# Patient Record
Sex: Female | Born: 1940
Health system: Southern US, Community
[De-identification: ages and names within clinical notes are randomized; demographics above are authoritative.]

## PROBLEM LIST (undated history)

## (undated) DIAGNOSIS — Z973 Presence of spectacles and contact lenses: Secondary | ICD-10-CM

## (undated) DIAGNOSIS — I10 Essential (primary) hypertension: Secondary | ICD-10-CM

## (undated) DIAGNOSIS — J302 Other seasonal allergic rhinitis: Secondary | ICD-10-CM

## (undated) DIAGNOSIS — D649 Anemia, unspecified: Secondary | ICD-10-CM

## (undated) DIAGNOSIS — M1990 Unspecified osteoarthritis, unspecified site: Secondary | ICD-10-CM

## (undated) DIAGNOSIS — Z972 Presence of dental prosthetic device (complete) (partial): Secondary | ICD-10-CM

## (undated) DIAGNOSIS — E7800 Pure hypercholesterolemia, unspecified: Secondary | ICD-10-CM

## (undated) HISTORY — PX: COLONOSCOPY: SHX174

---

## 1998-08-05 ENCOUNTER — Other Ambulatory Visit: Admission: RE | Admit: 1998-08-05 | Discharge: 1998-08-05 | Payer: Self-pay | Admitting: *Deleted

## 1999-10-06 ENCOUNTER — Other Ambulatory Visit: Admission: RE | Admit: 1999-10-06 | Discharge: 1999-10-06 | Payer: Self-pay | Admitting: *Deleted

## 2000-12-02 ENCOUNTER — Other Ambulatory Visit: Admission: RE | Admit: 2000-12-02 | Discharge: 2000-12-02 | Payer: Self-pay | Admitting: *Deleted

## 2002-11-16 ENCOUNTER — Encounter: Admission: RE | Admit: 2002-11-16 | Discharge: 2002-11-16 | Payer: Self-pay

## 2003-08-03 HISTORY — PX: CYST EXCISION: SHX5701

## 2004-05-29 ENCOUNTER — Encounter: Admission: RE | Admit: 2004-05-29 | Discharge: 2004-05-29 | Payer: Self-pay | Admitting: Family Medicine

## 2004-06-11 ENCOUNTER — Ambulatory Visit (HOSPITAL_COMMUNITY): Admission: RE | Admit: 2004-06-11 | Discharge: 2004-06-11 | Payer: Self-pay | Admitting: Orthopedic Surgery

## 2004-06-11 ENCOUNTER — Encounter (INDEPENDENT_AMBULATORY_CARE_PROVIDER_SITE_OTHER): Payer: Self-pay | Admitting: Specialist

## 2004-06-11 ENCOUNTER — Ambulatory Visit (HOSPITAL_BASED_OUTPATIENT_CLINIC_OR_DEPARTMENT_OTHER): Admission: RE | Admit: 2004-06-11 | Discharge: 2004-06-11 | Payer: Self-pay | Admitting: Orthopedic Surgery

## 2008-08-02 HISTORY — PX: TRIGGER FINGER RELEASE: SHX641

## 2009-06-03 ENCOUNTER — Ambulatory Visit (HOSPITAL_BASED_OUTPATIENT_CLINIC_OR_DEPARTMENT_OTHER): Admission: RE | Admit: 2009-06-03 | Discharge: 2009-06-03 | Payer: Self-pay | Admitting: Orthopedic Surgery

## 2010-11-04 LAB — POCT HEMOGLOBIN-HEMACUE
Hemoglobin: 7.8 g/dL — ABNORMAL LOW (ref 12.0–15.0)
Hemoglobin: 8 g/dL — ABNORMAL LOW (ref 12.0–15.0)

## 2010-12-18 NOTE — Op Note (Signed)
NAMEMICALAH, CABEZAS                ACCOUNT NO.:  [id number]   MEDICAL RECORD NO.:  [id number]          PATIENT TYPE:  AMB   LOCATION:  DSC                          FACILITY:  [facility]   PHYSICIAN:  Cindee Salt, M.D.       DATE OF BIRTH:  1940-08-18   DATE OF PROCEDURE:  06/11/2004  DATE OF DISCHARGE:                                 OPERATIVE REPORT   PREOPERATIVE DIAGNOSIS:  Stenosing tenosynovitis with thumb lock, right  thumb.   POSTOPERATIVE DIAGNOSIS:  Stenosing tenosynovitis with cyst, right thumb.   OPERATION PERFORMED:  Excision of cyst, release of A-1 pulley, right thumb.   SURGEON:  Cindee Salt, M.D.   ASSISTANTCarolyne Fiscal.   ANESTHESIA:  Forearm based IV regional.   INDICATIONS FOR PROCEDURE:  The patient is a 70 year old female with a  history of triggering of her right thumb which has locked.  She has a mass  on the palmar aspect of the A-1 pulley.   DESCRIPTION OF PROCEDURE:  The patient was brought to the operating room  where forearm based IV regional anesthetic was carried out without  difficulty.  She was prepped using DuraPrep in supine position, right arm  free.  A transverse incision was made over the flexor crease of her right  thumb and carried down through subcutaneous tissue.  Neurovascular  structures identified and protected.  Bleeders were electrocauterized.  The  flexor sheath was found to be extremely thick.  A cyst was present.  This  was removed.  The flexor sheath was then released on its radial aspect.  The  nodule on the flexor tendon was immediately apparent.  On completion of the  release, protecting the oblique pulley, the thumb was able to be placed  through a full range of motion, no further triggering was evident.  The  wound was irrigated.  The skin was closed with interrupted 5-0 nylon  sutures.  A sterile compressive dressing was applied.  The patient tolerated  the procedure well and was taken to the recovery room for observation in  satisfactory condition.  She is discharged to home to return to the [facility] of [place] in one week on Vicodin.       GK/MEDQ  D:  06/11/2004  T:  06/11/2004  Job:  [id number]

## 2012-01-13 DIAGNOSIS — M2010 Hallux valgus (acquired), unspecified foot: Secondary | ICD-10-CM | POA: Diagnosis not present

## 2012-01-13 DIAGNOSIS — M7660 Achilles tendinitis, unspecified leg: Secondary | ICD-10-CM | POA: Diagnosis not present

## 2012-01-13 DIAGNOSIS — M7730 Calcaneal spur, unspecified foot: Secondary | ICD-10-CM | POA: Diagnosis not present

## 2012-02-07 DIAGNOSIS — L600 Ingrowing nail: Secondary | ICD-10-CM | POA: Diagnosis not present

## 2012-02-07 DIAGNOSIS — M7660 Achilles tendinitis, unspecified leg: Secondary | ICD-10-CM | POA: Diagnosis not present

## 2012-03-09 DIAGNOSIS — M7660 Achilles tendinitis, unspecified leg: Secondary | ICD-10-CM | POA: Diagnosis not present

## 2012-03-09 DIAGNOSIS — M7730 Calcaneal spur, unspecified foot: Secondary | ICD-10-CM | POA: Diagnosis not present

## 2012-05-08 DIAGNOSIS — H2510 Age-related nuclear cataract, unspecified eye: Secondary | ICD-10-CM | POA: Diagnosis not present

## 2012-05-16 DIAGNOSIS — Z23 Encounter for immunization: Secondary | ICD-10-CM | POA: Diagnosis not present

## 2012-08-08 DIAGNOSIS — H40019 Open angle with borderline findings, low risk, unspecified eye: Secondary | ICD-10-CM | POA: Diagnosis not present

## 2012-08-09 DIAGNOSIS — Z713 Dietary counseling and surveillance: Secondary | ICD-10-CM | POA: Diagnosis not present

## 2012-08-09 DIAGNOSIS — R030 Elevated blood-pressure reading, without diagnosis of hypertension: Secondary | ICD-10-CM | POA: Diagnosis not present

## 2012-09-11 ENCOUNTER — Other Ambulatory Visit: Payer: Self-pay | Admitting: Internal Medicine

## 2012-09-11 DIAGNOSIS — Z1231 Encounter for screening mammogram for malignant neoplasm of breast: Secondary | ICD-10-CM

## 2012-09-20 DIAGNOSIS — I10 Essential (primary) hypertension: Secondary | ICD-10-CM | POA: Diagnosis not present

## 2012-09-20 DIAGNOSIS — E7800 Pure hypercholesterolemia, unspecified: Secondary | ICD-10-CM | POA: Diagnosis not present

## 2012-09-28 DIAGNOSIS — IMO0002 Reserved for concepts with insufficient information to code with codable children: Secondary | ICD-10-CM | POA: Diagnosis not present

## 2012-09-28 DIAGNOSIS — M999 Biomechanical lesion, unspecified: Secondary | ICD-10-CM | POA: Diagnosis not present

## 2012-09-28 DIAGNOSIS — M25559 Pain in unspecified hip: Secondary | ICD-10-CM | POA: Diagnosis not present

## 2012-10-02 ENCOUNTER — Other Ambulatory Visit: Payer: Self-pay

## 2012-10-02 DIAGNOSIS — Z1231 Encounter for screening mammogram for malignant neoplasm of breast: Secondary | ICD-10-CM

## 2012-10-04 ENCOUNTER — Inpatient Hospital Stay: Admission: RE | Admit: 2012-10-04 | Payer: Self-pay | Source: Ambulatory Visit

## 2012-10-10 DIAGNOSIS — M7660 Achilles tendinitis, unspecified leg: Secondary | ICD-10-CM | POA: Diagnosis not present

## 2012-10-10 DIAGNOSIS — M928 Other specified juvenile osteochondrosis: Secondary | ICD-10-CM | POA: Diagnosis not present

## 2012-10-11 ENCOUNTER — Ambulatory Visit: Payer: Self-pay

## 2012-10-31 DIAGNOSIS — M7660 Achilles tendinitis, unspecified leg: Secondary | ICD-10-CM | POA: Diagnosis not present

## 2012-11-02 ENCOUNTER — Ambulatory Visit: Payer: Self-pay

## 2012-11-03 ENCOUNTER — Ambulatory Visit: Payer: Self-pay | Admitting: Internal Medicine

## 2012-11-03 DIAGNOSIS — M7660 Achilles tendinitis, unspecified leg: Secondary | ICD-10-CM | POA: Diagnosis not present

## 2012-11-06 DIAGNOSIS — M7660 Achilles tendinitis, unspecified leg: Secondary | ICD-10-CM | POA: Diagnosis not present

## 2012-11-07 ENCOUNTER — Ambulatory Visit
Admission: RE | Admit: 2012-11-07 | Discharge: 2012-11-07 | Disposition: A | Discharge status: Home / Self Care | Payer: Medicare Other | Source: Ambulatory Visit

## 2012-11-07 DIAGNOSIS — Z1231 Encounter for screening mammogram for malignant neoplasm of breast: Secondary | ICD-10-CM | POA: Diagnosis not present

## 2012-11-08 DIAGNOSIS — M7660 Achilles tendinitis, unspecified leg: Secondary | ICD-10-CM | POA: Diagnosis not present

## 2012-11-10 DIAGNOSIS — M7660 Achilles tendinitis, unspecified leg: Secondary | ICD-10-CM | POA: Diagnosis not present

## 2012-11-13 DIAGNOSIS — M7660 Achilles tendinitis, unspecified leg: Secondary | ICD-10-CM | POA: Diagnosis not present

## 2012-11-15 DIAGNOSIS — M7660 Achilles tendinitis, unspecified leg: Secondary | ICD-10-CM | POA: Diagnosis not present

## 2012-11-17 DIAGNOSIS — M7660 Achilles tendinitis, unspecified leg: Secondary | ICD-10-CM | POA: Diagnosis not present

## 2012-12-21 DIAGNOSIS — I10 Essential (primary) hypertension: Secondary | ICD-10-CM | POA: Diagnosis not present

## 2012-12-21 DIAGNOSIS — E7800 Pure hypercholesterolemia, unspecified: Secondary | ICD-10-CM | POA: Diagnosis not present

## 2013-05-04 DIAGNOSIS — Z23 Encounter for immunization: Secondary | ICD-10-CM | POA: Diagnosis not present

## 2013-05-24 DIAGNOSIS — H2510 Age-related nuclear cataract, unspecified eye: Secondary | ICD-10-CM | POA: Diagnosis not present

## 2013-05-24 DIAGNOSIS — H40019 Open angle with borderline findings, low risk, unspecified eye: Secondary | ICD-10-CM | POA: Diagnosis not present

## 2013-06-22 DIAGNOSIS — E2839 Other primary ovarian failure: Secondary | ICD-10-CM | POA: Diagnosis not present

## 2013-06-22 DIAGNOSIS — I10 Essential (primary) hypertension: Secondary | ICD-10-CM | POA: Diagnosis not present

## 2013-06-22 DIAGNOSIS — E7800 Pure hypercholesterolemia, unspecified: Secondary | ICD-10-CM | POA: Diagnosis not present

## 2013-07-19 DIAGNOSIS — D485 Neoplasm of uncertain behavior of skin: Secondary | ICD-10-CM | POA: Diagnosis not present

## 2013-07-19 DIAGNOSIS — M899 Disorder of bone, unspecified: Secondary | ICD-10-CM | POA: Diagnosis not present

## 2013-07-19 DIAGNOSIS — Z780 Asymptomatic menopausal state: Secondary | ICD-10-CM | POA: Diagnosis not present

## 2013-09-20 ENCOUNTER — Other Ambulatory Visit: Payer: Self-pay | Admitting: Gastroenterology

## 2013-09-20 DIAGNOSIS — D126 Benign neoplasm of colon, unspecified: Secondary | ICD-10-CM | POA: Diagnosis not present

## 2013-09-20 DIAGNOSIS — Z1211 Encounter for screening for malignant neoplasm of colon: Secondary | ICD-10-CM | POA: Diagnosis not present

## 2013-09-20 DIAGNOSIS — K620 Anal polyp: Secondary | ICD-10-CM | POA: Diagnosis not present

## 2013-09-20 DIAGNOSIS — K5730 Diverticulosis of large intestine without perforation or abscess without bleeding: Secondary | ICD-10-CM | POA: Diagnosis not present

## 2013-09-20 DIAGNOSIS — K621 Rectal polyp: Secondary | ICD-10-CM | POA: Diagnosis not present

## 2013-09-20 DIAGNOSIS — Q438 Other specified congenital malformations of intestine: Secondary | ICD-10-CM | POA: Diagnosis not present

## 2014-05-21 DIAGNOSIS — L989 Disorder of the skin and subcutaneous tissue, unspecified: Secondary | ICD-10-CM | POA: Diagnosis not present

## 2014-05-21 DIAGNOSIS — E780 Pure hypercholesterolemia: Secondary | ICD-10-CM | POA: Diagnosis not present

## 2014-05-21 DIAGNOSIS — I10 Essential (primary) hypertension: Secondary | ICD-10-CM | POA: Diagnosis not present

## 2014-05-21 DIAGNOSIS — M949 Disorder of cartilage, unspecified: Secondary | ICD-10-CM | POA: Diagnosis not present

## 2014-05-28 DIAGNOSIS — H1045 Other chronic allergic conjunctivitis: Secondary | ICD-10-CM | POA: Diagnosis not present

## 2014-05-28 DIAGNOSIS — Z23 Encounter for immunization: Secondary | ICD-10-CM | POA: Diagnosis not present

## 2014-05-28 DIAGNOSIS — H4011X1 Primary open-angle glaucoma, mild stage: Secondary | ICD-10-CM | POA: Diagnosis not present

## 2014-06-03 ENCOUNTER — Other Ambulatory Visit: Payer: Self-pay | Admitting: Dermatology

## 2014-06-03 DIAGNOSIS — C44311 Basal cell carcinoma of skin of nose: Secondary | ICD-10-CM | POA: Diagnosis not present

## 2014-06-17 DIAGNOSIS — H04123 Dry eye syndrome of bilateral lacrimal glands: Secondary | ICD-10-CM | POA: Diagnosis not present

## 2014-06-17 DIAGNOSIS — H52223 Regular astigmatism, bilateral: Secondary | ICD-10-CM | POA: Diagnosis not present

## 2014-06-17 DIAGNOSIS — H1045 Other chronic allergic conjunctivitis: Secondary | ICD-10-CM | POA: Diagnosis not present

## 2014-06-17 DIAGNOSIS — H524 Presbyopia: Secondary | ICD-10-CM | POA: Diagnosis not present

## 2014-06-17 DIAGNOSIS — H5203 Hypermetropia, bilateral: Secondary | ICD-10-CM | POA: Diagnosis not present

## 2014-07-11 DIAGNOSIS — C44311 Basal cell carcinoma of skin of nose: Secondary | ICD-10-CM | POA: Diagnosis not present

## 2014-07-11 DIAGNOSIS — Z85828 Personal history of other malignant neoplasm of skin: Secondary | ICD-10-CM | POA: Diagnosis not present

## 2014-10-10 DIAGNOSIS — Z1389 Encounter for screening for other disorder: Secondary | ICD-10-CM | POA: Diagnosis not present

## 2014-10-10 DIAGNOSIS — E663 Overweight: Secondary | ICD-10-CM | POA: Diagnosis not present

## 2014-10-10 DIAGNOSIS — E780 Pure hypercholesterolemia: Secondary | ICD-10-CM | POA: Diagnosis not present

## 2014-10-10 DIAGNOSIS — Z0000 Encounter for general adult medical examination without abnormal findings: Secondary | ICD-10-CM | POA: Diagnosis not present

## 2014-10-10 DIAGNOSIS — M8580 Other specified disorders of bone density and structure, unspecified site: Secondary | ICD-10-CM | POA: Diagnosis not present

## 2014-10-10 DIAGNOSIS — I10 Essential (primary) hypertension: Secondary | ICD-10-CM | POA: Diagnosis not present

## 2014-10-10 DIAGNOSIS — Z6833 Body mass index (BMI) 33.0-33.9, adult: Secondary | ICD-10-CM | POA: Diagnosis not present

## 2014-10-10 DIAGNOSIS — E559 Vitamin D deficiency, unspecified: Secondary | ICD-10-CM | POA: Diagnosis not present

## 2014-10-18 DIAGNOSIS — Z1211 Encounter for screening for malignant neoplasm of colon: Secondary | ICD-10-CM | POA: Diagnosis not present

## 2014-10-23 DIAGNOSIS — M65331 Trigger finger, right middle finger: Secondary | ICD-10-CM | POA: Diagnosis not present

## 2014-10-23 DIAGNOSIS — M65322 Trigger finger, left index finger: Secondary | ICD-10-CM | POA: Diagnosis not present

## 2014-10-23 DIAGNOSIS — M65312 Trigger thumb, left thumb: Secondary | ICD-10-CM | POA: Diagnosis not present

## 2014-10-23 DIAGNOSIS — M65332 Trigger finger, left middle finger: Secondary | ICD-10-CM | POA: Diagnosis not present

## 2014-10-24 ENCOUNTER — Other Ambulatory Visit: Payer: Self-pay | Admitting: Orthopedic Surgery

## 2014-10-29 ENCOUNTER — Other Ambulatory Visit: Payer: Self-pay

## 2014-10-29 DIAGNOSIS — Z1231 Encounter for screening mammogram for malignant neoplasm of breast: Secondary | ICD-10-CM

## 2014-11-07 ENCOUNTER — Encounter (HOSPITAL_BASED_OUTPATIENT_CLINIC_OR_DEPARTMENT_OTHER): Payer: Self-pay | Admitting: *Deleted

## 2014-11-07 NOTE — Progress Notes (Signed)
Pt had recent labs will call dr Yolonda Kida need ekg

## 2014-11-12 ENCOUNTER — Encounter (HOSPITAL_BASED_OUTPATIENT_CLINIC_OR_DEPARTMENT_OTHER): Payer: Self-pay | Admitting: Orthopedic Surgery

## 2014-11-12 ENCOUNTER — Ambulatory Visit (HOSPITAL_BASED_OUTPATIENT_CLINIC_OR_DEPARTMENT_OTHER): Payer: Medicare Other | Admitting: Anesthesiology

## 2014-11-12 ENCOUNTER — Ambulatory Visit (HOSPITAL_BASED_OUTPATIENT_CLINIC_OR_DEPARTMENT_OTHER)
Admission: RE | Admit: 2014-11-12 | Discharge: 2014-11-12 | Disposition: A | Discharge status: Home / Self Care | Payer: Medicare Other | Source: Ambulatory Visit | Attending: Orthopedic Surgery | Admitting: Orthopedic Surgery

## 2014-11-12 ENCOUNTER — Encounter (HOSPITAL_BASED_OUTPATIENT_CLINIC_OR_DEPARTMENT_OTHER)
Admission: RE | Disposition: A | Discharge status: Home / Self Care | Payer: Self-pay | Source: Ambulatory Visit | Attending: Orthopedic Surgery

## 2014-11-12 DIAGNOSIS — Z880 Allergy status to penicillin: Secondary | ICD-10-CM | POA: Diagnosis not present

## 2014-11-12 DIAGNOSIS — I10 Essential (primary) hypertension: Secondary | ICD-10-CM | POA: Insufficient documentation

## 2014-11-12 DIAGNOSIS — M65342 Trigger finger, left ring finger: Secondary | ICD-10-CM | POA: Diagnosis not present

## 2014-11-12 DIAGNOSIS — M65331 Trigger finger, right middle finger: Secondary | ICD-10-CM | POA: Diagnosis not present

## 2014-11-12 DIAGNOSIS — M65332 Trigger finger, left middle finger: Secondary | ICD-10-CM | POA: Diagnosis not present

## 2014-11-12 DIAGNOSIS — M1990 Unspecified osteoarthritis, unspecified site: Secondary | ICD-10-CM | POA: Diagnosis not present

## 2014-11-12 DIAGNOSIS — M65312 Trigger thumb, left thumb: Secondary | ICD-10-CM | POA: Insufficient documentation

## 2014-11-12 DIAGNOSIS — Z87891 Personal history of nicotine dependence: Secondary | ICD-10-CM | POA: Diagnosis not present

## 2014-11-12 DIAGNOSIS — M65322 Trigger finger, left index finger: Secondary | ICD-10-CM | POA: Diagnosis not present

## 2014-11-12 HISTORY — DX: Essential (primary) hypertension: I10

## 2014-11-12 HISTORY — DX: Presence of spectacles and contact lenses: Z97.3

## 2014-11-12 HISTORY — PX: TRIGGER FINGER RELEASE: SHX641

## 2014-11-12 HISTORY — DX: Other seasonal allergic rhinitis: J30.2

## 2014-11-12 HISTORY — DX: Unspecified osteoarthritis, unspecified site: M19.90

## 2014-11-12 HISTORY — DX: Presence of dental prosthetic device (complete) (partial): Z97.2

## 2014-11-12 LAB — POCT I-STAT, CHEM 8
BUN: 16 mg/dL (ref 6–23)
Calcium, Ion: 1.27 mmol/L (ref 1.13–1.30)
Chloride: 103 mmol/L (ref 96–112)
Creatinine, Ser: 0.8 mg/dL (ref 0.50–1.10)
Glucose, Bld: 103 mg/dL — ABNORMAL HIGH (ref 70–99)
HEMATOCRIT: 29 % — AB (ref 36.0–46.0)
Hemoglobin: 9.9 g/dL — ABNORMAL LOW (ref 12.0–15.0)
Potassium: 3.7 mmol/L (ref 3.5–5.1)
SODIUM: 141 mmol/L (ref 135–145)
TCO2: 22 mmol/L (ref 0–100)

## 2014-11-12 SURGERY — RELEASE, A1 PULLEY, FOR TRIGGER FINGER
Anesthesia: Regional | Site: Finger | Laterality: Left

## 2014-11-12 MED ORDER — LACTATED RINGERS IV SOLN
INTRAVENOUS | Status: DC
Start: 2014-11-12 — End: 2014-11-12
  Administered 2014-11-12: 09:00:00 via INTRAVENOUS

## 2014-11-12 MED ORDER — PROPOFOL 10 MG/ML IV BOLUS
INTRAVENOUS | Status: DC | PRN
  Administered 2014-11-12: 150 mg via INTRAVENOUS

## 2014-11-12 MED ORDER — MIDAZOLAM HCL 2 MG/2ML IJ SOLN
INTRAMUSCULAR | Status: AC
  Filled 2014-11-12: qty 2

## 2014-11-12 MED ORDER — FENTANYL CITRATE 0.05 MG/ML IJ SOLN: 25.0000 ug | INTRAMUSCULAR | Status: DC | PRN

## 2014-11-12 MED ORDER — LIDOCAINE HCL (CARDIAC) 20 MG/ML IV SOLN
INTRAVENOUS | Status: DC | PRN
  Administered 2014-11-12: 50 mg via INTRAVENOUS

## 2014-11-12 MED ORDER — BUPIVACAINE HCL (PF) 0.25 % IJ SOLN
INTRAMUSCULAR | Status: DC | PRN
  Administered 2014-11-12: 9 mL

## 2014-11-12 MED ORDER — ONDANSETRON HCL 4 MG/2ML IJ SOLN
INTRAMUSCULAR | Status: DC | PRN
  Administered 2014-11-12 (×2): 4 mg via INTRAVENOUS

## 2014-11-12 MED ORDER — CHLORHEXIDINE GLUCONATE 4 % EX LIQD: 60.0000 mL | Freq: Once | CUTANEOUS | Status: DC

## 2014-11-12 MED ORDER — HYDROCODONE-ACETAMINOPHEN 5-325 MG PO TABS: 1.0000 | ORAL_TABLET | Freq: Four times a day (QID) | ORAL | Status: AC | PRN

## 2014-11-12 MED ORDER — FENTANYL CITRATE 0.05 MG/ML IJ SOLN: 50.0000 ug | INTRAMUSCULAR | Status: DC | PRN

## 2014-11-12 MED ORDER — VANCOMYCIN HCL IN DEXTROSE 1-5 GM/200ML-% IV SOLN
INTRAVENOUS | Status: AC
  Filled 2014-11-12: qty 200

## 2014-11-12 MED ORDER — FENTANYL CITRATE 0.05 MG/ML IJ SOLN
INTRAMUSCULAR | Status: DC | PRN
  Administered 2014-11-12 (×2): 50 ug via INTRAVENOUS

## 2014-11-12 MED ORDER — VANCOMYCIN HCL IN DEXTROSE 1-5 GM/200ML-% IV SOLN
1000.0000 mg | INTRAVENOUS | Status: AC
  Administered 2014-11-12 (×2): 1000 mg via INTRAVENOUS

## 2014-11-12 MED ORDER — FENTANYL CITRATE 0.05 MG/ML IJ SOLN
INTRAMUSCULAR | Status: AC
  Filled 2014-11-12: qty 2

## 2014-11-12 MED ORDER — MIDAZOLAM HCL 2 MG/2ML IJ SOLN
1.0000 mg | INTRAMUSCULAR | Status: DC | PRN
Start: 2014-11-12 — End: 2014-11-12

## 2014-11-12 MED ORDER — DEXAMETHASONE SODIUM PHOSPHATE 10 MG/ML IJ SOLN
INTRAMUSCULAR | Status: DC | PRN
  Administered 2014-11-12: 8 mg via INTRAVENOUS

## 2014-11-12 SURGICAL SUPPLY — 42 items
BANDAGE COBAN STERILE 2 (GAUZE/BANDAGES/DRESSINGS) ×3 IMPLANT
BLADE SURG 15 STRL LF DISP TIS (BLADE) ×1 IMPLANT
BLADE SURG 15 STRL SS (BLADE) ×3
BNDG CMPR 9X4 STRL LF SNTH (GAUZE/BANDAGES/DRESSINGS) ×1
BNDG ESMARK 4X9 LF (GAUZE/BANDAGES/DRESSINGS) ×2 IMPLANT
CHLORAPREP W/TINT 26ML (MISCELLANEOUS) ×3 IMPLANT
CORDS BIPOLAR (ELECTRODE) IMPLANT
COVER BACK TABLE 60X90IN (DRAPES) ×3 IMPLANT
COVER MAYO STAND STRL (DRAPES) ×3 IMPLANT
CUFF TOURNIQUET SINGLE 18IN (TOURNIQUET CUFF) ×2 IMPLANT
DECANTER SPIKE VIAL GLASS SM (MISCELLANEOUS) IMPLANT
DRAPE EXTREMITY T 121X128X90 (DRAPE) ×3 IMPLANT
DRAPE SURG 17X23 STRL (DRAPES) ×3 IMPLANT
GAUZE SPONGE 4X4 12PLY STRL (GAUZE/BANDAGES/DRESSINGS) ×3 IMPLANT
GAUZE XEROFORM 1X8 LF (GAUZE/BANDAGES/DRESSINGS) ×3 IMPLANT
GLOVE BIO SURGEON STRL SZ 6.5 (GLOVE) ×2 IMPLANT
GLOVE BIO SURGEON STRL SZ7.5 (GLOVE) ×2 IMPLANT
GLOVE BIO SURGEONS STRL SZ 6.5 (GLOVE) ×2
GLOVE BIOGEL PI IND STRL 6.5 (GLOVE) IMPLANT
GLOVE BIOGEL PI IND STRL 7.0 (GLOVE) IMPLANT
GLOVE BIOGEL PI IND STRL 8 (GLOVE) IMPLANT
GLOVE BIOGEL PI IND STRL 8.5 (GLOVE) ×1 IMPLANT
GLOVE BIOGEL PI INDICATOR 6.5 (GLOVE) ×2
GLOVE BIOGEL PI INDICATOR 7.0 (GLOVE) ×2
GLOVE BIOGEL PI INDICATOR 8 (GLOVE) ×2
GLOVE BIOGEL PI INDICATOR 8.5 (GLOVE) ×2
GLOVE ECLIPSE 6.5 STRL STRAW (GLOVE) ×2 IMPLANT
GLOVE SURG ORTHO 8.0 STRL STRW (GLOVE) ×3 IMPLANT
GOWN STRL REUS W/ TWL LRG LVL3 (GOWN DISPOSABLE) ×1 IMPLANT
GOWN STRL REUS W/TWL LRG LVL3 (GOWN DISPOSABLE) ×6
GOWN STRL REUS W/TWL XL LVL3 (GOWN DISPOSABLE) ×5 IMPLANT
NDL PRECISIONGLIDE 27X1.5 (NEEDLE) ×1 IMPLANT
NEEDLE PRECISIONGLIDE 27X1.5 (NEEDLE) ×3 IMPLANT
NS IRRIG 1000ML POUR BTL (IV SOLUTION) ×3 IMPLANT
PACK BASIN DAY SURGERY FS (CUSTOM PROCEDURE TRAY) ×3 IMPLANT
SLEEVE SCD COMPRESS KNEE MED (MISCELLANEOUS) ×2 IMPLANT
STOCKINETTE 4X48 STRL (DRAPES) ×3 IMPLANT
SUT ETHILON 4 0 PS 2 18 (SUTURE) ×3 IMPLANT
SYR BULB 3OZ (MISCELLANEOUS) ×3 IMPLANT
SYR CONTROL 10ML LL (SYRINGE) ×3 IMPLANT
TOWEL OR 17X24 6PK STRL BLUE (TOWEL DISPOSABLE) ×6 IMPLANT
UNDERPAD 30X30 INCONTINENT (UNDERPADS AND DIAPERS) ×3 IMPLANT

## 2014-11-12 NOTE — Op Note (Signed)
Dictation Number [id number]

## 2014-11-12 NOTE — Anesthesia Procedure Notes (Signed)
Procedure Name: LMA Insertion Date/Time: 11/12/2014 9:34 AM Performed by: Melynda Ripple D Pre-anesthesia Checklist: Patient identified, Emergency Drugs available, Suction available and Patient being monitored Patient Re-evaluated:Patient Re-evaluated prior to inductionOxygen Delivery Method: Circle System Utilized Preoxygenation: Pre-oxygenation with 100% oxygen Intubation Type: IV induction Ventilation: Mask ventilation without difficulty LMA: LMA inserted LMA Size: 4.0 Number of attempts: 1 Airway Equipment and Method: Bite block Placement Confirmation: positive ETCO2 Tube secured with: Tape Dental Injury: Teeth and Oropharynx as per pre-operative assessment

## 2014-11-12 NOTE — Anesthesia Preprocedure Evaluation (Addendum)
Anesthesia Evaluation  Patient identified by MRN, date of birth, ID band Patient awake    Airway Mallampati: I   Neck ROM: Full    Dental  (+) Teeth Intact   Pulmonary former smoker,  breath sounds clear to auscultation        Cardiovascular hypertension, Rhythm:Regular Rate:Normal     Neuro/Psych    GI/Hepatic   Endo/Other    Renal/GU      Musculoskeletal  (+) Arthritis -,   Abdominal   Peds  Hematology   Anesthesia Other Findings   Reproductive/Obstetrics                           Anesthesia Physical Anesthesia Plan  ASA: II  Anesthesia Plan: Bier Block   Post-op Pain Management:    Induction: Intravenous  Airway Management Planned: Simple Face Mask  Additional Equipment:   Intra-op Plan:   Post-operative Plan:   Informed Consent: I have reviewed the patients History and Physical, chart, labs and discussed the procedure including the risks, benefits and alternatives for the proposed anesthesia with the patient or authorized representative who has indicated his/her understanding and acceptance.     Plan Discussed with: CRNA and Surgeon  Anesthesia Plan Comments:         Anesthesia Quick Evaluation

## 2014-11-12 NOTE — Discharge Instructions (Addendum)

## 2014-11-12 NOTE — Anesthesia Postprocedure Evaluation (Signed)
  Anesthesia Post-op Note  Patient: Heather Dougherty  Procedure(s) Performed: Procedure(s) with comments: RELEASE A-1 PULLEY LEFT THUMB/INDEX FINGER/MIDDLE FINGER/RING FINGER (Left) - thumb middle long index  finger  Patient Location: PACU  Anesthesia Type: Bier Block   Level of Consciousness: awake, alert  and oriented  Airway and Oxygen Therapy: Patient Spontanous Breathing  Post-op Pain: mild  Post-op Assessment: Post-op Vital signs reviewed  Post-op Vital Signs: Reviewed  Last Vitals:  Filed Vitals:   11/12/14 1132  BP: 151/58  Pulse: 74  Temp: 36.7 C  Resp: 18    Complications: No apparent anesthesia complications

## 2014-11-12 NOTE — Brief Op Note (Signed)
11/12/2014  10:16 AM  PATIENT:  Heather Dougherty  74 y.o. female  PRE-OPERATIVE DIAGNOSIS:  STENOSING TENOSYNOVITIS LEFT THUMB/INDEX FINGER/MIDDLE FINGER/RING FINGER  POST-OPERATIVE DIAGNOSIS:  STENOSING TENOSYNOVITIS LEFT THUMB/INDEX  PROCEDURE:  Procedure(s) with comments: RELEASE A-1 PULLEY LEFT THUMB/INDEX FINGER/MIDDLE FINGER/RING FINGER (Left) - thumb middle long index  finger  SURGEON:  Surgeon(s) and Role:    * Daryll Brod, MD - Primary    * Leanora Cover, MD - Assisting  PHYSICIAN ASSISTANT:   ASSISTANTS: K Saira Kramme,MD   ANESTHESIA:  General and local  EBL:  Total I/O In: 400 [I.V.:400] Out: -   BLOOD ADMINISTERED:none  DRAINS: none   LOCAL MEDICATIONS USED:  BUPIVICAINE   SPECIMEN:  No Specimen  DISPOSITION OF SPECIMEN:  N/A  COUNTS:  YES  TOURNIQUET:   Total Tourniquet Time Documented: Upper Arm (Left) - 22 minutes Total: Upper Arm (Left) - 22 minutes   DICTATION: .Other Dictation: Dictation Number [id number]  PLAN OF CARE: Discharge to home after PACU  PATIENT DISPOSITION:  PACU - hemodynamically stable.

## 2014-11-12 NOTE — H&P (Signed)
  Heather Dougherty is a 74 year-old right-hand dominant former patient who has had trigger fingers in the past.  She comes in with a locked left thumb that has been going on for approximately three to four weeks.  She states her index and long finger are also locked.  She has no history of diabetes, thyroid problems, arthritis or gout.  She complains of a constant, moderate to severe, sharp burning type pain.  She is not complaining of numbness or tingling.  She has tried ibuprofen and aspirin without relief.  She has no history of injury.    ALLERGIES:   Penicillin.   MEDICATIONS:  Pataday, atorvastatin and HCTZ.   SURGICAL HISTORY:   C-sections, trigger finger releases after injections were unsuccessful.    FAMILY MEDICAL HISTORY:   Negative.   SOCIAL HISTORY:   She does not smoke, drinks socially, married and retired.   REVIEW OF SYSTEMS:    Positive for contacts, glasses, high blood pressure, otherwise negative 14 points.  Heather Dougherty is an 74 y.o. female.   Chief Complaint: trigger fingers thumb index middle and ring HPI: see above  Past Medical History  Diagnosis Date  . Arthritis   . Hypertension   . Wears contact lenses   . Wears partial dentures   . Seasonal allergies     Past Surgical History  Procedure Laterality Date  . Cesarean section      x4  . Cyst excision  2005    right thumb  . Trigger finger release  2010    right middle  . Colonoscopy      History reviewed. No pertinent family history. Social History:  reports that she quit smoking about 24 years ago. She does not have any smokeless tobacco history on file. She reports that she drinks alcohol. She reports that she does not use illicit drugs.  Allergies:  Allergies  Allergen Reactions  . Penicillins     unknown    No prescriptions prior to admission    No results found for this or any previous visit (from the past 48 hour(s)).  No results found.   Pertinent items are noted in HPI.  Height 5'  1" (1.549 m), weight 79.379 kg (175 lb).  General appearance: cooperative and appears stated age Head: Normocephalic, without obvious abnormality Neck: no JVD Resp: clear to auscultation bilaterally Cardio: regular rate and rhythm, S1, S2 normal, no murmur, click, rub or gallop GI: soft, non-tender; bowel sounds normal; no masses,  no organomegaly Extremities: trigger fingers and thumb Pulses: 2+ and symmetric Skin: Skin color, texture, turgor normal. No rashes or lesions Neurologic: Grossly normal Incision/Wound: na  Assessment/Plan DIAGNOSIS:   Stenosing tenosynovitis, thumb, index, middle and ring left hand.    RECOMMENDATIONS/PLAN:   I have discussed with her treatment alternatives including injection or surgical release.  She would like to have these surgically released.  We would recommend release of each one.  It is noted that [facility] grind stress test is negative, [facility] M[facility]lin test is negative.  Tenderness directly over the A-1 pulley of the left thumb, index, middle and ring finger.  She is aware that there is no guarantee with the surgery, the possibility of infection, recurrence, injury to arteries, nerves, tendons, incomplete relief of symptoms and dystrophy.  She is scheduled for release A-1 pulley left thumb, index, middle and ring fingers as outpatient under regional anesthesia.  Heather Dougherty R 11/12/2014, 4:21 AM

## 2014-11-12 NOTE — Transfer of Care (Signed)
Immediate Anesthesia Transfer of Care Note  Patient: Heather Dougherty  Procedure(s) Performed: Procedure(s) with comments: RELEASE A-1 PULLEY LEFT THUMB/INDEX FINGER/MIDDLE FINGER/RING FINGER (Left) - thumb middle long index  finger  Patient Location: PACU  Anesthesia Type:General  Level of Consciousness: sedated  Airway & Oxygen Therapy: Patient Spontanous Breathing and Patient connected to face mask oxygen  Post-op Assessment: Report given to RN and Post -op Vital signs reviewed and stable  Post vital signs: Reviewed and stable  Last Vitals:  Filed Vitals:   11/12/14 1017  BP:   Pulse: 74  Temp:   Resp: 10    Complications: No apparent anesthesia complications

## 2014-11-13 ENCOUNTER — Encounter (HOSPITAL_BASED_OUTPATIENT_CLINIC_OR_DEPARTMENT_OTHER): Payer: Self-pay | Admitting: Orthopedic Surgery

## 2014-11-13 NOTE — Op Note (Signed)
Heather Dougherty, Heather Dougherty NO.:  [id number]  MEDICAL RECORD NO.:  [id number]  LOCATION:                                 FACILITY:  PHYSICIAN:  Daryll Brod, M.D.            DATE OF BIRTH:  DATE OF PROCEDURE:  11/12/2014 DATE OF DISCHARGE:                              OPERATIVE REPORT   POSTOPERATIVE DIAGNOSIS:  Stenosing tenosynovitis, left thumb, index, middle, and ring fingers.  POSTOPERATIVE DIAGNOSIS:  Stenosing tenosynovitis, left thumb, index, middle, and ring fingers.  OPERATION:  Release of A1 pulley, left thumb, index, middle, and ring fingers.  SURGEON:  Daryll Brod, M.D.  ANESTHESIA:  General with local infiltration.  ANESTHESIOLOGIST:  Ala Dach, M.D.  HISTORY:  The patient is a 73 year old female with a history of triggering of his thumb, index, middle, and ring fingers, nonresponsive to conservative treatment.  She has elected to undergo surgical release. She has had trigger fingers released in the past.  The pre, peri, and postoperative course have been discussed along with risks and complications.  She is aware that there is no guarantee with surgery; possibility of infection; recurrence of injury to arteries, nerves, tendons; incomplete relief of symptoms; dystrophy.  In the preoperative area, the patient was seen, the extremity marked by both patient and surgeon.  Antibiotic given.  PROCEDURE IN DETAIL:  The patient was brought to the operating room, where general anesthetic was carried out without difficulty under the direction of Dr. Orene Desanctis.  She was prepped using ChloraPrep, supine position with the left arm free.  A 3-minute dry time was allowed.  Time- out taken, confirming the patient and procedure.  The limb was exsanguinated with an Esmarch bandage.  Tourniquet placed high on the arm, was inflated to 250 mmHg.  A transverse incision was made over the A1 pulley __________ joint crease of the left thumb, carried  down through subcutaneous tissue.  Neurovascular bundles radially and ulnarly identified.  Incision was then made on the radial aspect of the A1 pulley.  This was then released.  The tenosynovial tissue proximally was spread with blunt dissection.  The oblique pulley was left intact. Thumb placed through full range motion, no further triggering was noted. Separate incision was then made obliquely on the index finger, carried down through subcutaneous tissue.  Bleeders were electrocauterized with bipolar.  Neurovascular structures protected.  The A1 pulley was released on its radial aspect.  A small incision made centrally in the A2 pulley.  A partial tenosynovectomy performed proximally.  Moderate tenosynovial hyperproliferation was present.  The 2 tendons separated. The finger placed through a full range motion, no further triggering was noted.  A separate incision was then made on the middle finger, again carried down through subcutaneous tissue.  Neurovascular structures protected.  The A1 pulley identified, released on its radial aspect.  A small incision made centrally in A2.  A synovial tissue separated proximally with separation of the 2 tendons.  The finger placed through a full range and no further triggering was noted.  Oblique incision was then made on the ring finger, carried down through subcutaneous tissue. Bleeders again electrocauterized  as necessary with bipolar.  The incision was made on the radial aspect of the A1 pulley with protection of the neurovascular bundle.  A small incision made centrally in A2, tenosynovial tissue proximally separated along with the tendons.  The finger placed through full mobility and no triggering noted.  The wounds were then copiously irrigated with saline and closed with interrupted 4- 0 nylon sutures.  Local infiltration with 0.25% bupivacaine without epinephrine was given to each, 9 mL was used.  Sterile compressive dressing with the  finger sleeve was applied.  On deflation of the tourniquet, all fingers immediately pinked.  She was taken to the recovery room for observation in satisfactory condition.  She will be discharged home to return to [place] in 1 week on Norco.          ______________________________ Daryll Brod, M.D.     GK/MEDQ  D:  11/12/2014  T:  11/12/2014  Job:  [id number]

## 2014-11-14 NOTE — Anesthesia Postprocedure Evaluation (Signed)
  Anesthesia Post-op Note  Patient: Heather Dougherty  Procedure(s) Performed: Procedure(s) with comments: RELEASE A-1 PULLEY LEFT THUMB/INDEX FINGER/MIDDLE FINGER/RING FINGER (Left) - thumb middle long index  finger  Patient Location: PACU  Anesthesia Type:General  Level of Consciousness: awake and alert   Airway and Oxygen Therapy: Patient Spontanous Breathing  Post-op Pain: mild  Post-op Assessment: Post-op Vital signs reviewed  Post-op Vital Signs: stable  Last Vitals:  Filed Vitals:   11/12/14 1132  BP: 151/58  Pulse: 74  Temp: 36.7 C  Resp: 18    Complications: No apparent anesthesia complications

## 2014-11-19 ENCOUNTER — Ambulatory Visit
Admission: RE | Admit: 2014-11-19 | Discharge: 2014-11-19 | Disposition: A | Discharge status: Home / Self Care | Payer: Medicare Other | Source: Ambulatory Visit

## 2014-11-19 DIAGNOSIS — Z1231 Encounter for screening mammogram for malignant neoplasm of breast: Secondary | ICD-10-CM

## 2015-01-15 DIAGNOSIS — E559 Vitamin D deficiency, unspecified: Secondary | ICD-10-CM | POA: Diagnosis not present

## 2015-02-04 NOTE — Addendum Note (Signed)
Addendum  created 02/04/15 1105 by Rica Koyanagi, MD   Modules edited: Clinical Notes   Clinical Notes:  File: [id number]

## 2015-05-29 DIAGNOSIS — Z23 Encounter for immunization: Secondary | ICD-10-CM | POA: Diagnosis not present

## 2015-06-18 DIAGNOSIS — Z85828 Personal history of other malignant neoplasm of skin: Secondary | ICD-10-CM | POA: Diagnosis not present

## 2015-06-18 DIAGNOSIS — L720 Epidermal cyst: Secondary | ICD-10-CM | POA: Diagnosis not present

## 2015-06-18 DIAGNOSIS — L718 Other rosacea: Secondary | ICD-10-CM | POA: Diagnosis not present

## 2015-06-18 DIAGNOSIS — L821 Other seborrheic keratosis: Secondary | ICD-10-CM | POA: Diagnosis not present

## 2015-06-18 DIAGNOSIS — L738 Other specified follicular disorders: Secondary | ICD-10-CM | POA: Diagnosis not present

## 2015-06-18 DIAGNOSIS — D1801 Hemangioma of skin and subcutaneous tissue: Secondary | ICD-10-CM | POA: Diagnosis not present

## 2015-06-23 DIAGNOSIS — H2513 Age-related nuclear cataract, bilateral: Secondary | ICD-10-CM | POA: Diagnosis not present

## 2015-06-23 DIAGNOSIS — H04123 Dry eye syndrome of bilateral lacrimal glands: Secondary | ICD-10-CM | POA: Diagnosis not present

## 2015-07-04 DIAGNOSIS — G5602 Carpal tunnel syndrome, left upper limb: Secondary | ICD-10-CM | POA: Diagnosis not present

## 2015-07-04 DIAGNOSIS — R202 Paresthesia of skin: Secondary | ICD-10-CM | POA: Diagnosis not present

## 2015-07-04 DIAGNOSIS — G5601 Carpal tunnel syndrome, right upper limb: Secondary | ICD-10-CM | POA: Diagnosis not present

## 2015-07-04 DIAGNOSIS — M47812 Spondylosis without myelopathy or radiculopathy, cervical region: Secondary | ICD-10-CM | POA: Diagnosis not present

## 2015-08-07 DIAGNOSIS — R208 Other disturbances of skin sensation: Secondary | ICD-10-CM | POA: Diagnosis not present

## 2015-08-20 DIAGNOSIS — R202 Paresthesia of skin: Secondary | ICD-10-CM | POA: Diagnosis not present

## 2015-09-15 DIAGNOSIS — G5600 Carpal tunnel syndrome, unspecified upper limb: Secondary | ICD-10-CM | POA: Diagnosis not present

## 2015-09-15 DIAGNOSIS — G629 Polyneuropathy, unspecified: Secondary | ICD-10-CM | POA: Diagnosis not present

## 2015-10-13 DIAGNOSIS — Z0000 Encounter for general adult medical examination without abnormal findings: Secondary | ICD-10-CM | POA: Diagnosis not present

## 2015-10-13 DIAGNOSIS — D649 Anemia, unspecified: Secondary | ICD-10-CM | POA: Diagnosis not present

## 2015-10-13 DIAGNOSIS — Z1389 Encounter for screening for other disorder: Secondary | ICD-10-CM | POA: Diagnosis not present

## 2015-10-13 DIAGNOSIS — M8580 Other specified disorders of bone density and structure, unspecified site: Secondary | ICD-10-CM | POA: Diagnosis not present

## 2015-10-13 DIAGNOSIS — E7800 Pure hypercholesterolemia, unspecified: Secondary | ICD-10-CM | POA: Diagnosis not present

## 2015-10-13 DIAGNOSIS — I10 Essential (primary) hypertension: Secondary | ICD-10-CM | POA: Diagnosis not present

## 2015-10-13 DIAGNOSIS — G5601 Carpal tunnel syndrome, right upper limb: Secondary | ICD-10-CM | POA: Diagnosis not present

## 2015-11-04 DIAGNOSIS — G5602 Carpal tunnel syndrome, left upper limb: Secondary | ICD-10-CM | POA: Diagnosis not present

## 2015-11-04 DIAGNOSIS — G5601 Carpal tunnel syndrome, right upper limb: Secondary | ICD-10-CM | POA: Diagnosis not present

## 2015-11-12 DIAGNOSIS — D649 Anemia, unspecified: Secondary | ICD-10-CM | POA: Diagnosis not present

## 2015-11-13 DIAGNOSIS — Z1211 Encounter for screening for malignant neoplasm of colon: Secondary | ICD-10-CM | POA: Diagnosis not present

## 2015-11-20 ENCOUNTER — Other Ambulatory Visit: Payer: Self-pay | Admitting: Internal Medicine

## 2015-11-20 ENCOUNTER — Ambulatory Visit
Admission: RE | Admit: 2015-11-20 | Discharge: 2015-11-20 | Disposition: A | Discharge status: Home / Self Care | Payer: Medicare Other | Source: Ambulatory Visit | Attending: Internal Medicine | Admitting: Internal Medicine

## 2015-11-20 DIAGNOSIS — M79605 Pain in left leg: Secondary | ICD-10-CM | POA: Diagnosis not present

## 2015-11-20 DIAGNOSIS — M7989 Other specified soft tissue disorders: Secondary | ICD-10-CM | POA: Diagnosis not present

## 2015-11-20 DIAGNOSIS — M25562 Pain in left knee: Secondary | ICD-10-CM | POA: Diagnosis not present

## 2015-11-28 DIAGNOSIS — M65321 Trigger finger, right index finger: Secondary | ICD-10-CM | POA: Diagnosis not present

## 2015-11-28 DIAGNOSIS — M65341 Trigger finger, right ring finger: Secondary | ICD-10-CM | POA: Diagnosis not present

## 2015-11-28 DIAGNOSIS — G5601 Carpal tunnel syndrome, right upper limb: Secondary | ICD-10-CM | POA: Diagnosis not present

## 2016-04-15 DIAGNOSIS — D509 Iron deficiency anemia, unspecified: Secondary | ICD-10-CM | POA: Diagnosis not present

## 2016-04-15 DIAGNOSIS — Z6833 Body mass index (BMI) 33.0-33.9, adult: Secondary | ICD-10-CM | POA: Diagnosis not present

## 2016-04-15 DIAGNOSIS — E663 Overweight: Secondary | ICD-10-CM | POA: Diagnosis not present

## 2016-04-15 DIAGNOSIS — Z23 Encounter for immunization: Secondary | ICD-10-CM | POA: Diagnosis not present

## 2016-04-15 DIAGNOSIS — I10 Essential (primary) hypertension: Secondary | ICD-10-CM | POA: Diagnosis not present

## 2016-04-15 DIAGNOSIS — E7800 Pure hypercholesterolemia, unspecified: Secondary | ICD-10-CM | POA: Diagnosis not present

## 2016-04-16 ENCOUNTER — Emergency Department (HOSPITAL_COMMUNITY)
Admission: EM | Admit: 2016-04-16 | Discharge: 2016-04-16 | Disposition: A | Discharge status: Home / Self Care | Payer: Medicare Other | Attending: Emergency Medicine | Admitting: Emergency Medicine

## 2016-04-16 ENCOUNTER — Encounter (HOSPITAL_COMMUNITY): Payer: Self-pay | Admitting: Emergency Medicine

## 2016-04-16 DIAGNOSIS — I10 Essential (primary) hypertension: Secondary | ICD-10-CM | POA: Insufficient documentation

## 2016-04-16 DIAGNOSIS — Z87891 Personal history of nicotine dependence: Secondary | ICD-10-CM | POA: Diagnosis not present

## 2016-04-16 DIAGNOSIS — Z79899 Other long term (current) drug therapy: Secondary | ICD-10-CM | POA: Insufficient documentation

## 2016-04-16 DIAGNOSIS — D649 Anemia, unspecified: Secondary | ICD-10-CM | POA: Diagnosis not present

## 2016-04-16 DIAGNOSIS — Z7982 Long term (current) use of aspirin: Secondary | ICD-10-CM | POA: Diagnosis not present

## 2016-04-16 DIAGNOSIS — R5383 Other fatigue: Secondary | ICD-10-CM | POA: Diagnosis present

## 2016-04-16 LAB — URINALYSIS, ROUTINE W REFLEX MICROSCOPIC
Bilirubin Urine: NEGATIVE
Glucose, UA: NEGATIVE mg/dL
Hgb urine dipstick: NEGATIVE
KETONES UR: NEGATIVE mg/dL
LEUKOCYTES UA: NEGATIVE
NITRITE: NEGATIVE
PH: 5.5 (ref 5.0–8.0)
Protein, ur: NEGATIVE mg/dL
SPECIFIC GRAVITY, URINE: 1.011 (ref 1.005–1.030)

## 2016-04-16 LAB — BASIC METABOLIC PANEL
ANION GAP: 7 (ref 5–15)
BUN: 17 mg/dL (ref 6–20)
CO2: 27 mmol/L (ref 22–32)
Calcium: 10.2 mg/dL (ref 8.9–10.3)
Chloride: 104 mmol/L (ref 101–111)
Creatinine, Ser: 0.74 mg/dL (ref 0.44–1.00)
GFR calc non Af Amer: >60 mL/min (ref >60)
Glucose, Bld: 117 mg/dL — ABNORMAL HIGH (ref 65–99)
POTASSIUM: 3.5 mmol/L (ref 3.5–5.1)
Sodium: 138 mmol/L (ref 135–145)

## 2016-04-16 LAB — TYPE AND SCREEN
ABO/RH(D): A POS
ANTIBODY SCREEN: NEGATIVE

## 2016-04-16 LAB — ABO/RH: ABO/RH(D): A POS

## 2016-04-16 LAB — CBC
HEMATOCRIT: 26.6 % — AB (ref 36.0–46.0)
Hemoglobin: 7.2 g/dL — ABNORMAL LOW (ref 12.0–15.0)
MCH: 19.5 pg — ABNORMAL LOW (ref 26.0–34.0)
MCHC: 27.1 g/dL — ABNORMAL LOW (ref 30.0–36.0)
MCV: 72.1 fL — ABNORMAL LOW (ref 78.0–100.0)
Platelets: 261 10*3/uL (ref 150–400)
RBC: 3.69 MIL/uL — ABNORMAL LOW (ref 3.87–5.11)
RDW: 18.7 % — ABNORMAL HIGH (ref 11.5–15.5)
WBC: 5.8 10*3/uL (ref 4.0–10.5)

## 2016-04-16 LAB — CBG MONITORING, ED: Glucose-Capillary: 106 mg/dL — ABNORMAL HIGH (ref 65–99)

## 2016-04-16 NOTE — ED Triage Notes (Addendum)
Pt to ED after visiting PCP and getting labs drawn. Pt Hgb is 6.9. Pt not complaining of being tired or SOB. Pt denies any blood in stool. Denies N/V.

## 2016-04-16 NOTE — ED Notes (Signed)
Patient has an appt with PCP on 2 pm Monday, 9/18.

## 2016-04-16 NOTE — ED Provider Notes (Signed)
[facility] DEPT Provider Note   CSN: [id number] Arrival date & time: 04/16/16  1154     History   Chief Complaint Chief Complaint  Patient presents with  . Abnormal Lab    6.9 Hgb (per PCP)  . Fatigue    HPI Heather Dougherty is a 75 y.o. female.  HPI  Patient was instructed to present to the ED for hemoglobin of 6.9 by GI clinic. She reports that she was seen for routine follow-up with her primary care provider and noted to have low hemoglobin. Repeat CBC following that revealed a hemoglobin of 6.9. Given the fact that the patient was fairly asymptomatic other than mild fatigue, she was referred to GI for evaluation. Patient was called by GI clinic and instructed to present to the ED given her low hemoglobin. The patient is denying any chest pain, shortness of breath, dizziness, vision changes, hematochezia, melena.  She does endorse mild fatigue. No recent fevers, illnesses or infections.  Patient does endorse a history of anemia with a hemoglobin baseline around 9. She is currently on daily iron. She denies any blood thinners.   Past Medical History:  Diagnosis Date  . Arthritis   . Hypertension   . Seasonal allergies   . Wears contact lenses   . Wears partial dentures     There are no active problems to display for this patient.   Past Surgical History:  Procedure Laterality Date  . CESAREAN SECTION     x4  . COLONOSCOPY    . CYST EXCISION  2005   right thumb  . TRIGGER FINGER RELEASE  2010   right middle  . TRIGGER FINGER RELEASE Left 11/12/2014   Procedure: RELEASE A-1 PULLEY LEFT THUMB/INDEX FINGER/MIDDLE FINGER/RING FINGER;  Surgeon: Daryll Brod, MD;  Location: [facility];  Service: Orthopedics;  Laterality: Left;  thumb middle long index  finger    OB History    No data available       Home Medications    Prior to Admission medications   Medication Sig Start Date End Date Taking? Authorizing Provider  aspirin EC 81 MG tablet Take  81 mg by mouth daily.   Yes Historical Provider, MD  atorvastatin (LIPITOR) 10 MG tablet Take 10 mg by mouth daily.   Yes Historical Provider, MD  Coenzyme Q10 (CO Q 10 PO) Take 1 tablet by mouth daily.   Yes Historical Provider, MD  Cyanocobalamin (VITAMIN B 12 PO) Take 1 tablet by mouth daily.   Yes Historical Provider, MD  ferrous sulfate 325 (65 FE) MG tablet Take 325 mg by mouth 2 (two) times daily.   Yes Historical Provider, MD  fluticasone (FLONASE) 50 MCG/ACT nasal spray Place 1 spray into both nostrils daily as needed for allergies or rhinitis.   Yes Historical Provider, MD  hydrochlorothiazide (HYDRODIURIL) 25 MG tablet Take 25 mg by mouth daily.   Yes Historical Provider, MD  MAGNESIUM PO Take 1 tablet by mouth daily.   Yes Historical Provider, MD  Olopatadine HCl 0.2 % SOLN Apply to eye every morning.   Yes Historical Provider, MD  HYDROcodone-acetaminophen (NORCO) 5-325 MG per tablet Take 1 tablet by mouth every 6 (six) hours as needed for moderate pain. Patient not taking: Reported on 04/16/2016 11/12/14   Daryll Brod, MD    Family History History reviewed. No pertinent family history.  Social History Social History  Substance Use Topics  . Smoking status: Former Smoker    Quit date: 11/07/1990  .  Smokeless tobacco: Never Used  . Alcohol use Yes     Comment: daily 2 glasses wine     Allergies   Penicillins   Review of Systems Review of Systems  Constitutional: Positive for fatigue. Negative for chills and fever.  HENT: Negative for congestion and sore throat.   Eyes: Negative for visual disturbance.  Respiratory: Negative for cough, chest tightness and shortness of breath.   Cardiovascular: Negative for chest pain and palpitations.  Gastrointestinal: Negative for abdominal pain, blood in stool, diarrhea, nausea and vomiting.  Genitourinary: Negative for decreased urine volume and difficulty urinating.  Musculoskeletal: Negative for back pain and neck stiffness.    Skin: Negative for rash.  Neurological: Negative for light-headedness and headaches.  Psychiatric/Behavioral: Negative for confusion.  All other systems reviewed and are negative.    Physical Exam Updated Vital Signs BP 151/70 (BP Location: Left Arm)   Pulse 81   Temp 98 F (36.7 C) (Oral)   Resp 17   Ht 5\' 1"  (1.549 m)   Wt 177 lb (80.3 kg)   SpO2 99%   BMI 33.44 kg/m   Physical Exam  Constitutional: She is oriented to person, place, and time. She appears well-developed and well-nourished. No distress.  HENT:  Head: Normocephalic and atraumatic.  Nose: Nose normal.  Eyes: Conjunctivae and EOM are normal. Pupils are equal, round, and reactive to light. Right eye exhibits no discharge. Left eye exhibits no discharge. No scleral icterus.  Neck: Normal range of motion. Neck supple.  Cardiovascular: Normal rate and regular rhythm.  Exam reveals no gallop and no friction rub.   No murmur heard. Pulmonary/Chest: Effort normal and breath sounds normal. No stridor. No respiratory distress. She has no rales.  Abdominal: Soft. She exhibits no distension. There is no tenderness.  Musculoskeletal: She exhibits no edema or tenderness.  Neurological: She is alert and oriented to person, place, and time.  Skin: Skin is warm and dry. No rash noted. She is not diaphoretic. No erythema.  Psychiatric: She has a normal mood and affect.  Vitals reviewed.    ED Treatments / Results  Labs (all labs ordered are listed, but only abnormal results are displayed) Labs Reviewed  BASIC METABOLIC PANEL - Abnormal; Notable for the following:       Result Value   Glucose, Bld 117 (*)    All other components within normal limits  CBC - Abnormal; Notable for the following:    RBC 3.69 (*)    Hemoglobin 7.2 (*)    HCT 26.6 (*)    MCV 72.1 (*)    MCH 19.5 (*)    MCHC 27.1 (*)    RDW 18.7 (*)    All other components within normal limits  CBG MONITORING, ED - Abnormal; Notable for the following:     Glucose-Capillary 106 (*)    All other components within normal limits  URINALYSIS, ROUTINE W REFLEX MICROSCOPIC (NOT AT [facility])  TYPE AND SCREEN  ABO/RH    EKG  EKG Interpretation None       Radiology No results found.  Procedures Procedures (including critical care time)  Medications Ordered in ED Medications - No data to display   Initial Impression / Assessment and Plan / ED Course  I have reviewed the triage vital signs and the nursing notes.  Pertinent labs & imaging results that were available during my care of the patient were reviewed by me and considered in my medical decision making (see chart for details).  Clinical Course    Relatively asymptomatic anemia other than mild fatigue. We'll repeat blood work here. Repeat hemoglobin is 7.2. Other labs reassuring. No indication for transfusion at this time. However patient does need close PCP and GI follow-up.  Safe for discharge with close PCP follow-up.  Final Clinical Impressions(s) / ED Diagnoses   Final diagnoses:  Anemia, unspecified anemia type   Disposition: Discharge  Condition: Good  I have discussed the results, Dx and Tx plan with the patient who expressed understanding and agree(s) with the plan. Discharge instructions discussed at great length. The patient was given strict return precautions who verbalized understanding of the instructions. No further questions at time of discharge.    Discharge Medication List as of 04/16/2016  2:45 PM      Follow Up: Seward Dameka, MD [address] [place] [address] [phone]  Schedule an appointment as soon as possible for a visit in 2 days For close follow up      Fatima Blank, MD 04/16/16 1625

## 2016-04-19 DIAGNOSIS — D509 Iron deficiency anemia, unspecified: Secondary | ICD-10-CM | POA: Diagnosis not present

## 2016-04-26 DIAGNOSIS — Z1211 Encounter for screening for malignant neoplasm of colon: Secondary | ICD-10-CM | POA: Diagnosis not present

## 2016-05-03 DIAGNOSIS — D649 Anemia, unspecified: Secondary | ICD-10-CM | POA: Diagnosis not present

## 2016-06-03 DIAGNOSIS — D649 Anemia, unspecified: Secondary | ICD-10-CM | POA: Diagnosis not present

## 2016-06-18 DIAGNOSIS — D2271 Melanocytic nevi of right lower limb, including hip: Secondary | ICD-10-CM | POA: Diagnosis not present

## 2016-06-18 DIAGNOSIS — L821 Other seborrheic keratosis: Secondary | ICD-10-CM | POA: Diagnosis not present

## 2016-06-18 DIAGNOSIS — D225 Melanocytic nevi of trunk: Secondary | ICD-10-CM | POA: Diagnosis not present

## 2016-06-18 DIAGNOSIS — L720 Epidermal cyst: Secondary | ICD-10-CM | POA: Diagnosis not present

## 2016-06-18 DIAGNOSIS — Z85828 Personal history of other malignant neoplasm of skin: Secondary | ICD-10-CM | POA: Diagnosis not present

## 2016-06-18 DIAGNOSIS — L814 Other melanin hyperpigmentation: Secondary | ICD-10-CM | POA: Diagnosis not present

## 2016-06-18 DIAGNOSIS — D1801 Hemangioma of skin and subcutaneous tissue: Secondary | ICD-10-CM | POA: Diagnosis not present

## 2016-06-18 DIAGNOSIS — L570 Actinic keratosis: Secondary | ICD-10-CM | POA: Diagnosis not present

## 2016-06-18 DIAGNOSIS — L820 Inflamed seborrheic keratosis: Secondary | ICD-10-CM | POA: Diagnosis not present

## 2016-07-14 DIAGNOSIS — G5602 Carpal tunnel syndrome, left upper limb: Secondary | ICD-10-CM | POA: Diagnosis not present

## 2016-08-10 DIAGNOSIS — H2513 Age-related nuclear cataract, bilateral: Secondary | ICD-10-CM | POA: Diagnosis not present

## 2016-09-27 DIAGNOSIS — H2513 Age-related nuclear cataract, bilateral: Secondary | ICD-10-CM | POA: Diagnosis not present

## 2016-09-27 DIAGNOSIS — H25013 Cortical age-related cataract, bilateral: Secondary | ICD-10-CM | POA: Diagnosis not present

## 2016-09-27 DIAGNOSIS — H2511 Age-related nuclear cataract, right eye: Secondary | ICD-10-CM | POA: Diagnosis not present

## 2016-10-07 DIAGNOSIS — M170 Bilateral primary osteoarthritis of knee: Secondary | ICD-10-CM | POA: Diagnosis not present

## 2016-10-24 ENCOUNTER — Other Ambulatory Visit: Payer: Self-pay | Admitting: Physician Assistant

## 2016-10-24 ENCOUNTER — Ambulatory Visit (HOSPITAL_COMMUNITY)
Admission: RE | Admit: 2016-10-24 | Discharge: 2016-10-24 | Disposition: A | Discharge status: Home / Self Care | Payer: Medicare Other | Source: Ambulatory Visit | Attending: Physician Assistant | Admitting: Physician Assistant

## 2016-10-24 DIAGNOSIS — M79604 Pain in right leg: Secondary | ICD-10-CM

## 2016-10-24 DIAGNOSIS — M25561 Pain in right knee: Secondary | ICD-10-CM | POA: Diagnosis not present

## 2016-10-24 DIAGNOSIS — S76311A Strain of muscle, fascia and tendon of the posterior muscle group at thigh level, right thigh, initial encounter: Secondary | ICD-10-CM | POA: Diagnosis not present

## 2016-10-24 DIAGNOSIS — M25551 Pain in right hip: Secondary | ICD-10-CM | POA: Diagnosis not present

## 2016-10-24 NOTE — Progress Notes (Signed)
VASCULAR LAB PRELIMINARY  PRELIMINARY  PRELIMINARY  PRELIMINARY  Right lower extremity venous duplex completed.    Preliminary report:  There is no DVT, SVT, or Baker's cyst noted in the right lower extremity.  Called report to [place], PA  [place], [employer], RVT 10/24/2016, 2:49 PM

## 2016-10-25 DIAGNOSIS — S76311D Strain of muscle, fascia and tendon of the posterior muscle group at thigh level, right thigh, subsequent encounter: Secondary | ICD-10-CM | POA: Diagnosis not present

## 2016-10-25 DIAGNOSIS — M25561 Pain in right knee: Secondary | ICD-10-CM | POA: Diagnosis not present

## 2016-10-26 ENCOUNTER — Other Ambulatory Visit: Payer: Self-pay | Admitting: Orthopedic Surgery

## 2016-10-26 DIAGNOSIS — M545 Low back pain, unspecified: Secondary | ICD-10-CM

## 2016-10-26 DIAGNOSIS — M8700 Idiopathic aseptic necrosis of unspecified bone: Secondary | ICD-10-CM

## 2016-10-26 DIAGNOSIS — M25551 Pain in right hip: Secondary | ICD-10-CM

## 2016-10-26 DIAGNOSIS — S76311D Strain of muscle, fascia and tendon of the posterior muscle group at thigh level, right thigh, subsequent encounter: Secondary | ICD-10-CM | POA: Diagnosis not present

## 2016-11-06 ENCOUNTER — Other Ambulatory Visit: Payer: Medicare Other

## 2016-11-08 DIAGNOSIS — S76311D Strain of muscle, fascia and tendon of the posterior muscle group at thigh level, right thigh, subsequent encounter: Secondary | ICD-10-CM | POA: Diagnosis not present

## 2016-11-23 DIAGNOSIS — H2511 Age-related nuclear cataract, right eye: Secondary | ICD-10-CM | POA: Diagnosis not present

## 2016-11-24 DIAGNOSIS — H2512 Age-related nuclear cataract, left eye: Secondary | ICD-10-CM | POA: Diagnosis not present

## 2016-11-30 DIAGNOSIS — H2512 Age-related nuclear cataract, left eye: Secondary | ICD-10-CM | POA: Diagnosis not present

## 2017-01-06 DIAGNOSIS — S76311D Strain of muscle, fascia and tendon of the posterior muscle group at thigh level, right thigh, subsequent encounter: Secondary | ICD-10-CM | POA: Diagnosis not present

## 2017-01-17 DIAGNOSIS — R2681 Unsteadiness on feet: Secondary | ICD-10-CM | POA: Diagnosis not present

## 2017-01-17 DIAGNOSIS — R262 Difficulty in walking, not elsewhere classified: Secondary | ICD-10-CM | POA: Diagnosis not present

## 2017-02-07 DIAGNOSIS — R2681 Unsteadiness on feet: Secondary | ICD-10-CM | POA: Diagnosis not present

## 2017-02-07 DIAGNOSIS — R262 Difficulty in walking, not elsewhere classified: Secondary | ICD-10-CM | POA: Diagnosis not present

## 2017-02-09 DIAGNOSIS — R2681 Unsteadiness on feet: Secondary | ICD-10-CM | POA: Diagnosis not present

## 2017-02-09 DIAGNOSIS — R262 Difficulty in walking, not elsewhere classified: Secondary | ICD-10-CM | POA: Diagnosis not present

## 2017-02-28 DIAGNOSIS — R2681 Unsteadiness on feet: Secondary | ICD-10-CM | POA: Diagnosis not present

## 2017-02-28 DIAGNOSIS — R262 Difficulty in walking, not elsewhere classified: Secondary | ICD-10-CM | POA: Diagnosis not present

## 2017-03-01 DIAGNOSIS — M170 Bilateral primary osteoarthritis of knee: Secondary | ICD-10-CM | POA: Diagnosis not present

## 2017-03-16 DIAGNOSIS — R262 Difficulty in walking, not elsewhere classified: Secondary | ICD-10-CM | POA: Diagnosis not present

## 2017-03-16 DIAGNOSIS — R2681 Unsteadiness on feet: Secondary | ICD-10-CM | POA: Diagnosis not present

## 2017-03-31 DIAGNOSIS — M8580 Other specified disorders of bone density and structure, unspecified site: Secondary | ICD-10-CM | POA: Diagnosis not present

## 2017-03-31 DIAGNOSIS — Z0000 Encounter for general adult medical examination without abnormal findings: Secondary | ICD-10-CM | POA: Diagnosis not present

## 2017-03-31 DIAGNOSIS — D649 Anemia, unspecified: Secondary | ICD-10-CM | POA: Diagnosis not present

## 2017-03-31 DIAGNOSIS — Z1389 Encounter for screening for other disorder: Secondary | ICD-10-CM | POA: Diagnosis not present

## 2017-03-31 DIAGNOSIS — I10 Essential (primary) hypertension: Secondary | ICD-10-CM | POA: Diagnosis not present

## 2017-03-31 DIAGNOSIS — E7800 Pure hypercholesterolemia, unspecified: Secondary | ICD-10-CM | POA: Diagnosis not present

## 2017-04-06 ENCOUNTER — Other Ambulatory Visit: Payer: Self-pay | Admitting: Internal Medicine

## 2017-04-06 DIAGNOSIS — R2681 Unsteadiness on feet: Secondary | ICD-10-CM | POA: Diagnosis not present

## 2017-04-06 DIAGNOSIS — R262 Difficulty in walking, not elsewhere classified: Secondary | ICD-10-CM | POA: Diagnosis not present

## 2017-04-06 DIAGNOSIS — D509 Iron deficiency anemia, unspecified: Secondary | ICD-10-CM | POA: Diagnosis not present

## 2017-04-06 DIAGNOSIS — Z1231 Encounter for screening mammogram for malignant neoplasm of breast: Secondary | ICD-10-CM

## 2017-04-14 ENCOUNTER — Ambulatory Visit
Admission: RE | Admit: 2017-04-14 | Discharge: 2017-04-14 | Disposition: A | Discharge status: Home / Self Care | Payer: Medicare Other | Source: Ambulatory Visit | Attending: Internal Medicine | Admitting: Internal Medicine

## 2017-04-14 DIAGNOSIS — Z1231 Encounter for screening mammogram for malignant neoplasm of breast: Secondary | ICD-10-CM

## 2017-04-19 DIAGNOSIS — K31819 Angiodysplasia of stomach and duodenum without bleeding: Secondary | ICD-10-CM | POA: Diagnosis not present

## 2017-04-19 DIAGNOSIS — K3189 Other diseases of stomach and duodenum: Secondary | ICD-10-CM | POA: Diagnosis not present

## 2017-04-19 DIAGNOSIS — D509 Iron deficiency anemia, unspecified: Secondary | ICD-10-CM | POA: Diagnosis not present

## 2017-04-20 DIAGNOSIS — D509 Iron deficiency anemia, unspecified: Secondary | ICD-10-CM | POA: Diagnosis not present

## 2017-05-02 DIAGNOSIS — Z23 Encounter for immunization: Secondary | ICD-10-CM | POA: Diagnosis not present

## 2017-05-12 DIAGNOSIS — M8588 Other specified disorders of bone density and structure, other site: Secondary | ICD-10-CM | POA: Diagnosis not present

## 2017-05-12 DIAGNOSIS — D649 Anemia, unspecified: Secondary | ICD-10-CM | POA: Diagnosis not present

## 2017-05-30 DIAGNOSIS — D509 Iron deficiency anemia, unspecified: Secondary | ICD-10-CM | POA: Diagnosis not present

## 2017-05-31 DIAGNOSIS — D649 Anemia, unspecified: Secondary | ICD-10-CM | POA: Diagnosis not present

## 2017-06-08 DIAGNOSIS — D225 Melanocytic nevi of trunk: Secondary | ICD-10-CM | POA: Diagnosis not present

## 2017-06-08 DIAGNOSIS — D485 Neoplasm of uncertain behavior of skin: Secondary | ICD-10-CM | POA: Diagnosis not present

## 2017-06-08 DIAGNOSIS — L820 Inflamed seborrheic keratosis: Secondary | ICD-10-CM | POA: Diagnosis not present

## 2017-06-08 DIAGNOSIS — L814 Other melanin hyperpigmentation: Secondary | ICD-10-CM | POA: Diagnosis not present

## 2017-06-08 DIAGNOSIS — L821 Other seborrheic keratosis: Secondary | ICD-10-CM | POA: Diagnosis not present

## 2017-06-08 DIAGNOSIS — Z85828 Personal history of other malignant neoplasm of skin: Secondary | ICD-10-CM | POA: Diagnosis not present

## 2017-10-03 DIAGNOSIS — D649 Anemia, unspecified: Secondary | ICD-10-CM | POA: Diagnosis not present

## 2018-01-05 DIAGNOSIS — M1712 Unilateral primary osteoarthritis, left knee: Secondary | ICD-10-CM | POA: Diagnosis not present

## 2018-04-19 DIAGNOSIS — Z23 Encounter for immunization: Secondary | ICD-10-CM | POA: Diagnosis not present

## 2018-04-19 DIAGNOSIS — Z6833 Body mass index (BMI) 33.0-33.9, adult: Secondary | ICD-10-CM | POA: Diagnosis not present

## 2018-04-19 DIAGNOSIS — E7800 Pure hypercholesterolemia, unspecified: Secondary | ICD-10-CM | POA: Diagnosis not present

## 2018-04-19 DIAGNOSIS — I10 Essential (primary) hypertension: Secondary | ICD-10-CM | POA: Diagnosis not present

## 2018-04-19 DIAGNOSIS — E663 Overweight: Secondary | ICD-10-CM | POA: Diagnosis not present

## 2018-04-19 DIAGNOSIS — M79675 Pain in left toe(s): Secondary | ICD-10-CM | POA: Diagnosis not present

## 2018-04-19 DIAGNOSIS — Z1389 Encounter for screening for other disorder: Secondary | ICD-10-CM | POA: Diagnosis not present

## 2018-04-19 DIAGNOSIS — D509 Iron deficiency anemia, unspecified: Secondary | ICD-10-CM | POA: Diagnosis not present

## 2018-04-19 DIAGNOSIS — M8580 Other specified disorders of bone density and structure, unspecified site: Secondary | ICD-10-CM | POA: Diagnosis not present

## 2018-04-19 DIAGNOSIS — Z0000 Encounter for general adult medical examination without abnormal findings: Secondary | ICD-10-CM | POA: Diagnosis not present

## 2018-06-09 DIAGNOSIS — L570 Actinic keratosis: Secondary | ICD-10-CM | POA: Diagnosis not present

## 2018-06-09 DIAGNOSIS — L738 Other specified follicular disorders: Secondary | ICD-10-CM | POA: Diagnosis not present

## 2018-06-09 DIAGNOSIS — L820 Inflamed seborrheic keratosis: Secondary | ICD-10-CM | POA: Diagnosis not present

## 2018-06-09 DIAGNOSIS — L821 Other seborrheic keratosis: Secondary | ICD-10-CM | POA: Diagnosis not present

## 2018-06-09 DIAGNOSIS — L814 Other melanin hyperpigmentation: Secondary | ICD-10-CM | POA: Diagnosis not present

## 2018-06-09 DIAGNOSIS — Z85828 Personal history of other malignant neoplasm of skin: Secondary | ICD-10-CM | POA: Diagnosis not present

## 2018-09-28 DIAGNOSIS — M21612 Bunion of left foot: Secondary | ICD-10-CM | POA: Diagnosis not present

## 2018-09-28 DIAGNOSIS — M898X7 Other specified disorders of bone, ankle and foot: Secondary | ICD-10-CM | POA: Diagnosis not present

## 2018-09-28 DIAGNOSIS — M21611 Bunion of right foot: Secondary | ICD-10-CM | POA: Diagnosis not present

## 2018-12-26 DIAGNOSIS — S76911A Strain of unspecified muscles, fascia and tendons at thigh level, right thigh, initial encounter: Secondary | ICD-10-CM | POA: Diagnosis not present

## 2019-01-11 DIAGNOSIS — M47816 Spondylosis without myelopathy or radiculopathy, lumbar region: Secondary | ICD-10-CM | POA: Diagnosis not present

## 2019-01-11 DIAGNOSIS — M25551 Pain in right hip: Secondary | ICD-10-CM | POA: Diagnosis not present

## 2019-01-19 DIAGNOSIS — M79604 Pain in right leg: Secondary | ICD-10-CM | POA: Diagnosis not present

## 2019-01-19 DIAGNOSIS — M25551 Pain in right hip: Secondary | ICD-10-CM | POA: Diagnosis not present

## 2019-01-29 DIAGNOSIS — M79604 Pain in right leg: Secondary | ICD-10-CM | POA: Diagnosis not present

## 2019-01-29 DIAGNOSIS — M25551 Pain in right hip: Secondary | ICD-10-CM | POA: Diagnosis not present

## 2019-02-09 DIAGNOSIS — M25551 Pain in right hip: Secondary | ICD-10-CM | POA: Diagnosis not present

## 2019-02-09 DIAGNOSIS — M79604 Pain in right leg: Secondary | ICD-10-CM | POA: Diagnosis not present

## 2019-02-13 DIAGNOSIS — M79604 Pain in right leg: Secondary | ICD-10-CM | POA: Diagnosis not present

## 2019-02-13 DIAGNOSIS — M25551 Pain in right hip: Secondary | ICD-10-CM | POA: Diagnosis not present

## 2019-02-21 DIAGNOSIS — M25551 Pain in right hip: Secondary | ICD-10-CM | POA: Diagnosis not present

## 2019-02-21 DIAGNOSIS — M79604 Pain in right leg: Secondary | ICD-10-CM | POA: Diagnosis not present

## 2019-03-01 DIAGNOSIS — M25551 Pain in right hip: Secondary | ICD-10-CM | POA: Diagnosis not present

## 2019-03-01 DIAGNOSIS — M79604 Pain in right leg: Secondary | ICD-10-CM | POA: Diagnosis not present

## 2019-03-09 DIAGNOSIS — M79604 Pain in right leg: Secondary | ICD-10-CM | POA: Diagnosis not present

## 2019-03-09 DIAGNOSIS — M25551 Pain in right hip: Secondary | ICD-10-CM | POA: Diagnosis not present

## 2019-03-23 DIAGNOSIS — M79604 Pain in right leg: Secondary | ICD-10-CM | POA: Diagnosis not present

## 2019-03-23 DIAGNOSIS — M25551 Pain in right hip: Secondary | ICD-10-CM | POA: Diagnosis not present

## 2019-03-29 DIAGNOSIS — Z961 Presence of intraocular lens: Secondary | ICD-10-CM | POA: Diagnosis not present

## 2019-04-06 DIAGNOSIS — M79604 Pain in right leg: Secondary | ICD-10-CM | POA: Diagnosis not present

## 2019-04-06 DIAGNOSIS — M25551 Pain in right hip: Secondary | ICD-10-CM | POA: Diagnosis not present

## 2019-04-29 DIAGNOSIS — Z23 Encounter for immunization: Secondary | ICD-10-CM | POA: Diagnosis not present

## 2019-05-02 DIAGNOSIS — M79604 Pain in right leg: Secondary | ICD-10-CM | POA: Diagnosis not present

## 2019-05-02 DIAGNOSIS — M25551 Pain in right hip: Secondary | ICD-10-CM | POA: Diagnosis not present

## 2019-05-16 ENCOUNTER — Other Ambulatory Visit: Payer: Self-pay | Admitting: Internal Medicine

## 2019-05-16 DIAGNOSIS — Z0000 Encounter for general adult medical examination without abnormal findings: Secondary | ICD-10-CM | POA: Diagnosis not present

## 2019-05-16 DIAGNOSIS — M8580 Other specified disorders of bone density and structure, unspecified site: Secondary | ICD-10-CM

## 2019-05-16 DIAGNOSIS — Z1389 Encounter for screening for other disorder: Secondary | ICD-10-CM | POA: Diagnosis not present

## 2019-05-16 DIAGNOSIS — Z1231 Encounter for screening mammogram for malignant neoplasm of breast: Secondary | ICD-10-CM

## 2019-05-16 DIAGNOSIS — E7800 Pure hypercholesterolemia, unspecified: Secondary | ICD-10-CM | POA: Diagnosis not present

## 2019-05-16 DIAGNOSIS — I10 Essential (primary) hypertension: Secondary | ICD-10-CM | POA: Diagnosis not present

## 2019-05-16 DIAGNOSIS — D509 Iron deficiency anemia, unspecified: Secondary | ICD-10-CM | POA: Diagnosis not present

## 2019-05-16 DIAGNOSIS — E663 Overweight: Secondary | ICD-10-CM | POA: Diagnosis not present

## 2019-07-04 DIAGNOSIS — L570 Actinic keratosis: Secondary | ICD-10-CM | POA: Diagnosis not present

## 2019-07-04 DIAGNOSIS — D225 Melanocytic nevi of trunk: Secondary | ICD-10-CM | POA: Diagnosis not present

## 2019-07-04 DIAGNOSIS — L738 Other specified follicular disorders: Secondary | ICD-10-CM | POA: Diagnosis not present

## 2019-07-04 DIAGNOSIS — Z85828 Personal history of other malignant neoplasm of skin: Secondary | ICD-10-CM | POA: Diagnosis not present

## 2019-07-04 DIAGNOSIS — L718 Other rosacea: Secondary | ICD-10-CM | POA: Diagnosis not present

## 2019-07-04 DIAGNOSIS — L821 Other seborrheic keratosis: Secondary | ICD-10-CM | POA: Diagnosis not present

## 2019-07-04 DIAGNOSIS — L820 Inflamed seborrheic keratosis: Secondary | ICD-10-CM | POA: Diagnosis not present

## 2019-07-04 DIAGNOSIS — L814 Other melanin hyperpigmentation: Secondary | ICD-10-CM | POA: Diagnosis not present

## 2019-08-07 ENCOUNTER — Ambulatory Visit: Payer: Medicare Other

## 2019-08-07 ENCOUNTER — Other Ambulatory Visit: Payer: Medicare Other

## 2019-08-15 DIAGNOSIS — Z23 Encounter for immunization: Secondary | ICD-10-CM | POA: Diagnosis not present

## 2019-09-11 DIAGNOSIS — Z23 Encounter for immunization: Secondary | ICD-10-CM | POA: Diagnosis not present

## 2019-10-10 ENCOUNTER — Ambulatory Visit
Admission: RE | Admit: 2019-10-10 | Discharge: 2019-10-10 | Disposition: A | Discharge status: Home / Self Care | Payer: Medicare Other | Source: Ambulatory Visit | Attending: Internal Medicine | Admitting: Internal Medicine

## 2019-10-10 ENCOUNTER — Other Ambulatory Visit: Payer: Self-pay

## 2019-10-10 DIAGNOSIS — Z780 Asymptomatic menopausal state: Secondary | ICD-10-CM | POA: Diagnosis not present

## 2019-10-10 DIAGNOSIS — Z13820 Encounter for screening for osteoporosis: Secondary | ICD-10-CM | POA: Diagnosis not present

## 2019-10-10 DIAGNOSIS — Z1231 Encounter for screening mammogram for malignant neoplasm of breast: Secondary | ICD-10-CM | POA: Diagnosis not present

## 2019-10-10 DIAGNOSIS — M8580 Other specified disorders of bone density and structure, unspecified site: Secondary | ICD-10-CM

## 2020-01-28 ENCOUNTER — Emergency Department (HOSPITAL_COMMUNITY)
Admission: EM | Admit: 2020-01-28 | Discharge: 2020-01-28 | Disposition: A | Discharge status: Home / Self Care | Payer: Medicare Other | Attending: Emergency Medicine | Admitting: Emergency Medicine

## 2020-01-28 ENCOUNTER — Other Ambulatory Visit: Payer: Self-pay

## 2020-01-28 ENCOUNTER — Emergency Department (HOSPITAL_COMMUNITY): Payer: Medicare Other

## 2020-01-28 ENCOUNTER — Encounter (HOSPITAL_COMMUNITY): Payer: Self-pay

## 2020-01-28 DIAGNOSIS — M25511 Pain in right shoulder: Secondary | ICD-10-CM | POA: Diagnosis not present

## 2020-01-28 DIAGNOSIS — S0181XA Laceration without foreign body of other part of head, initial encounter: Secondary | ICD-10-CM

## 2020-01-28 DIAGNOSIS — Y929 Unspecified place or not applicable: Secondary | ICD-10-CM | POA: Diagnosis not present

## 2020-01-28 DIAGNOSIS — Y93I9 Activity, other involving external motion: Secondary | ICD-10-CM | POA: Insufficient documentation

## 2020-01-28 DIAGNOSIS — Z79899 Other long term (current) drug therapy: Secondary | ICD-10-CM | POA: Insufficient documentation

## 2020-01-28 DIAGNOSIS — Z7982 Long term (current) use of aspirin: Secondary | ICD-10-CM | POA: Insufficient documentation

## 2020-01-28 DIAGNOSIS — S0993XA Unspecified injury of face, initial encounter: Secondary | ICD-10-CM | POA: Diagnosis present

## 2020-01-28 DIAGNOSIS — Y999 Unspecified external cause status: Secondary | ICD-10-CM | POA: Insufficient documentation

## 2020-01-28 DIAGNOSIS — I10 Essential (primary) hypertension: Secondary | ICD-10-CM | POA: Diagnosis not present

## 2020-01-28 DIAGNOSIS — S022XXA Fracture of nasal bones, initial encounter for closed fracture: Secondary | ICD-10-CM

## 2020-01-28 DIAGNOSIS — Z87891 Personal history of nicotine dependence: Secondary | ICD-10-CM | POA: Diagnosis not present

## 2020-01-28 DIAGNOSIS — S0121XA Laceration without foreign body of nose, initial encounter: Secondary | ICD-10-CM | POA: Insufficient documentation

## 2020-01-28 DIAGNOSIS — S4990XA Unspecified injury of shoulder and upper arm, unspecified arm, initial encounter: Secondary | ICD-10-CM

## 2020-01-28 DIAGNOSIS — S4991XA Unspecified injury of right shoulder and upper arm, initial encounter: Secondary | ICD-10-CM | POA: Diagnosis not present

## 2020-01-28 HISTORY — DX: Pure hypercholesterolemia, unspecified: E78.00

## 2020-01-28 HISTORY — DX: Anemia, unspecified: D64.9

## 2020-01-28 MED ORDER — OXYCODONE-ACETAMINOPHEN 5-325 MG PO TABS
1.0000 | ORAL_TABLET | Freq: Once | ORAL | Status: AC
  Administered 2020-01-28: 1 via ORAL
  Filled 2020-01-28: qty 1

## 2020-01-28 MED ORDER — LIDOCAINE HCL 2 % IJ SOLN
10.0000 mL | Freq: Once | INTRAMUSCULAR | Status: AC
  Administered 2020-01-28: 200 mg
  Filled 2020-01-28: qty 20

## 2020-01-28 MED ORDER — SULFAMETHOXAZOLE-TRIMETHOPRIM 800-160 MG PO TABS: 1.0000 | ORAL_TABLET | Freq: Two times a day (BID) | ORAL | 0 refills | Status: AC
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
## 2020-01-28 NOTE — Discharge Instructions (Signed)
Take tylenol, motrin for pain   Apply ice for swelling   You have a small nasal bone fracture.  You have a laceration that was sutured. Take bactrim twice daily for a week to prevent infection   See ENT for follow up   Suture removal in a week   Return to ER if you have worse pain, uncontrolled bleeding, headaches, vomiting
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
## 2020-02-14 ENCOUNTER — Other Ambulatory Visit: Payer: Self-pay | Admitting: Orthopedic Surgery

## 2020-02-14 DIAGNOSIS — M25511 Pain in right shoulder: Secondary | ICD-10-CM

## 2020-02-19 DIAGNOSIS — M25511 Pain in right shoulder: Secondary | ICD-10-CM | POA: Diagnosis not present

## 2020-02-19 DIAGNOSIS — M75101 Unspecified rotator cuff tear or rupture of right shoulder, not specified as traumatic: Secondary | ICD-10-CM | POA: Diagnosis not present

## 2020-02-19 DIAGNOSIS — M6281 Muscle weakness (generalized): Secondary | ICD-10-CM | POA: Diagnosis not present

## 2020-02-21 DIAGNOSIS — M75101 Unspecified rotator cuff tear or rupture of right shoulder, not specified as traumatic: Secondary | ICD-10-CM | POA: Diagnosis not present

## 2020-02-21 DIAGNOSIS — M25511 Pain in right shoulder: Secondary | ICD-10-CM | POA: Diagnosis not present

## 2020-02-21 DIAGNOSIS — M6281 Muscle weakness (generalized): Secondary | ICD-10-CM | POA: Diagnosis not present

## 2020-02-26 DIAGNOSIS — M25511 Pain in right shoulder: Secondary | ICD-10-CM | POA: Diagnosis not present

## 2020-02-26 DIAGNOSIS — M75101 Unspecified rotator cuff tear or rupture of right shoulder, not specified as traumatic: Secondary | ICD-10-CM | POA: Diagnosis not present

## 2020-02-26 DIAGNOSIS — M6281 Muscle weakness (generalized): Secondary | ICD-10-CM | POA: Diagnosis not present

## 2020-02-27 DIAGNOSIS — M25511 Pain in right shoulder: Secondary | ICD-10-CM | POA: Diagnosis not present

## 2020-02-27 DIAGNOSIS — M6281 Muscle weakness (generalized): Secondary | ICD-10-CM | POA: Diagnosis not present

## 2020-02-27 DIAGNOSIS — M75101 Unspecified rotator cuff tear or rupture of right shoulder, not specified as traumatic: Secondary | ICD-10-CM | POA: Diagnosis not present

## 2020-03-04 DIAGNOSIS — M6281 Muscle weakness (generalized): Secondary | ICD-10-CM | POA: Diagnosis not present

## 2020-03-04 DIAGNOSIS — M75101 Unspecified rotator cuff tear or rupture of right shoulder, not specified as traumatic: Secondary | ICD-10-CM | POA: Diagnosis not present

## 2020-03-04 DIAGNOSIS — M25511 Pain in right shoulder: Secondary | ICD-10-CM | POA: Diagnosis not present

## 2020-03-07 DIAGNOSIS — M6281 Muscle weakness (generalized): Secondary | ICD-10-CM | POA: Diagnosis not present

## 2020-03-07 DIAGNOSIS — M25511 Pain in right shoulder: Secondary | ICD-10-CM | POA: Diagnosis not present

## 2020-03-07 DIAGNOSIS — M75101 Unspecified rotator cuff tear or rupture of right shoulder, not specified as traumatic: Secondary | ICD-10-CM | POA: Diagnosis not present

## 2020-03-11 DIAGNOSIS — M75101 Unspecified rotator cuff tear or rupture of right shoulder, not specified as traumatic: Secondary | ICD-10-CM | POA: Diagnosis not present

## 2020-03-11 DIAGNOSIS — M25511 Pain in right shoulder: Secondary | ICD-10-CM | POA: Diagnosis not present

## 2020-03-11 DIAGNOSIS — M6281 Muscle weakness (generalized): Secondary | ICD-10-CM | POA: Diagnosis not present

## 2020-03-14 ENCOUNTER — Ambulatory Visit
Admission: RE | Admit: 2020-03-14 | Discharge: 2020-03-14 | Disposition: A | Discharge status: Home / Self Care | Payer: Medicare Other | Source: Ambulatory Visit | Attending: Orthopedic Surgery | Admitting: Orthopedic Surgery

## 2020-03-14 DIAGNOSIS — M25511 Pain in right shoulder: Secondary | ICD-10-CM | POA: Diagnosis not present

## 2020-03-14 DIAGNOSIS — M75121 Complete rotator cuff tear or rupture of right shoulder, not specified as traumatic: Secondary | ICD-10-CM | POA: Diagnosis not present

## 2020-03-14 DIAGNOSIS — M75101 Unspecified rotator cuff tear or rupture of right shoulder, not specified as traumatic: Secondary | ICD-10-CM | POA: Diagnosis not present

## 2020-03-14 DIAGNOSIS — M6281 Muscle weakness (generalized): Secondary | ICD-10-CM | POA: Diagnosis not present

## 2020-03-14 DIAGNOSIS — G8929 Other chronic pain: Secondary | ICD-10-CM

## 2020-03-18 DIAGNOSIS — M25511 Pain in right shoulder: Secondary | ICD-10-CM | POA: Diagnosis not present

## 2020-03-18 DIAGNOSIS — M6281 Muscle weakness (generalized): Secondary | ICD-10-CM | POA: Diagnosis not present

## 2020-03-18 DIAGNOSIS — M75101 Unspecified rotator cuff tear or rupture of right shoulder, not specified as traumatic: Secondary | ICD-10-CM | POA: Diagnosis not present

## 2020-03-20 DIAGNOSIS — M25511 Pain in right shoulder: Secondary | ICD-10-CM | POA: Diagnosis not present

## 2020-03-20 DIAGNOSIS — M75101 Unspecified rotator cuff tear or rupture of right shoulder, not specified as traumatic: Secondary | ICD-10-CM | POA: Diagnosis not present

## 2020-03-20 DIAGNOSIS — M6281 Muscle weakness (generalized): Secondary | ICD-10-CM | POA: Diagnosis not present

## 2020-03-27 DIAGNOSIS — M6281 Muscle weakness (generalized): Secondary | ICD-10-CM | POA: Diagnosis not present

## 2020-03-27 DIAGNOSIS — M75101 Unspecified rotator cuff tear or rupture of right shoulder, not specified as traumatic: Secondary | ICD-10-CM | POA: Diagnosis not present

## 2020-03-27 DIAGNOSIS — M25511 Pain in right shoulder: Secondary | ICD-10-CM | POA: Diagnosis not present

## 2020-03-31 DIAGNOSIS — S46011A Strain of muscle(s) and tendon(s) of the rotator cuff of right shoulder, initial encounter: Secondary | ICD-10-CM | POA: Diagnosis not present

## 2020-04-01 DIAGNOSIS — M75101 Unspecified rotator cuff tear or rupture of right shoulder, not specified as traumatic: Secondary | ICD-10-CM | POA: Diagnosis not present

## 2020-04-01 DIAGNOSIS — M25511 Pain in right shoulder: Secondary | ICD-10-CM | POA: Diagnosis not present

## 2020-04-01 DIAGNOSIS — M6281 Muscle weakness (generalized): Secondary | ICD-10-CM | POA: Diagnosis not present

## 2020-04-03 DIAGNOSIS — M75101 Unspecified rotator cuff tear or rupture of right shoulder, not specified as traumatic: Secondary | ICD-10-CM | POA: Diagnosis not present

## 2020-04-03 DIAGNOSIS — M25511 Pain in right shoulder: Secondary | ICD-10-CM | POA: Diagnosis not present

## 2020-04-03 DIAGNOSIS — M6281 Muscle weakness (generalized): Secondary | ICD-10-CM | POA: Diagnosis not present

## 2020-04-08 DIAGNOSIS — M75101 Unspecified rotator cuff tear or rupture of right shoulder, not specified as traumatic: Secondary | ICD-10-CM | POA: Diagnosis not present

## 2020-04-08 DIAGNOSIS — M25511 Pain in right shoulder: Secondary | ICD-10-CM | POA: Diagnosis not present

## 2020-04-08 DIAGNOSIS — M6281 Muscle weakness (generalized): Secondary | ICD-10-CM | POA: Diagnosis not present

## 2020-04-10 DIAGNOSIS — M6281 Muscle weakness (generalized): Secondary | ICD-10-CM | POA: Diagnosis not present

## 2020-04-10 DIAGNOSIS — M75101 Unspecified rotator cuff tear or rupture of right shoulder, not specified as traumatic: Secondary | ICD-10-CM | POA: Diagnosis not present

## 2020-04-10 DIAGNOSIS — M25511 Pain in right shoulder: Secondary | ICD-10-CM | POA: Diagnosis not present

## 2020-04-15 DIAGNOSIS — M25511 Pain in right shoulder: Secondary | ICD-10-CM | POA: Diagnosis not present

## 2020-04-15 DIAGNOSIS — M75101 Unspecified rotator cuff tear or rupture of right shoulder, not specified as traumatic: Secondary | ICD-10-CM | POA: Diagnosis not present

## 2020-04-15 DIAGNOSIS — M6281 Muscle weakness (generalized): Secondary | ICD-10-CM | POA: Diagnosis not present

## 2020-04-17 DIAGNOSIS — M25511 Pain in right shoulder: Secondary | ICD-10-CM | POA: Diagnosis not present

## 2020-04-17 DIAGNOSIS — M6281 Muscle weakness (generalized): Secondary | ICD-10-CM | POA: Diagnosis not present

## 2020-04-17 DIAGNOSIS — M75101 Unspecified rotator cuff tear or rupture of right shoulder, not specified as traumatic: Secondary | ICD-10-CM | POA: Diagnosis not present

## 2020-04-22 DIAGNOSIS — M75101 Unspecified rotator cuff tear or rupture of right shoulder, not specified as traumatic: Secondary | ICD-10-CM | POA: Diagnosis not present

## 2020-04-22 DIAGNOSIS — M6281 Muscle weakness (generalized): Secondary | ICD-10-CM | POA: Diagnosis not present

## 2020-04-22 DIAGNOSIS — M25511 Pain in right shoulder: Secondary | ICD-10-CM | POA: Diagnosis not present

## 2020-04-28 DIAGNOSIS — S46011D Strain of muscle(s) and tendon(s) of the rotator cuff of right shoulder, subsequent encounter: Secondary | ICD-10-CM | POA: Diagnosis not present

## 2020-04-29 DIAGNOSIS — M25511 Pain in right shoulder: Secondary | ICD-10-CM | POA: Diagnosis not present

## 2020-04-29 DIAGNOSIS — M75101 Unspecified rotator cuff tear or rupture of right shoulder, not specified as traumatic: Secondary | ICD-10-CM | POA: Diagnosis not present

## 2020-04-29 DIAGNOSIS — M6281 Muscle weakness (generalized): Secondary | ICD-10-CM | POA: Diagnosis not present

## 2020-05-01 DIAGNOSIS — M25511 Pain in right shoulder: Secondary | ICD-10-CM | POA: Diagnosis not present

## 2020-05-01 DIAGNOSIS — M6281 Muscle weakness (generalized): Secondary | ICD-10-CM | POA: Diagnosis not present

## 2020-05-01 DIAGNOSIS — M75101 Unspecified rotator cuff tear or rupture of right shoulder, not specified as traumatic: Secondary | ICD-10-CM | POA: Diagnosis not present

## 2020-05-03 DIAGNOSIS — Z23 Encounter for immunization: Secondary | ICD-10-CM | POA: Diagnosis not present

## 2020-05-06 DIAGNOSIS — M6281 Muscle weakness (generalized): Secondary | ICD-10-CM | POA: Diagnosis not present

## 2020-05-06 DIAGNOSIS — M25511 Pain in right shoulder: Secondary | ICD-10-CM | POA: Diagnosis not present

## 2020-05-06 DIAGNOSIS — M75101 Unspecified rotator cuff tear or rupture of right shoulder, not specified as traumatic: Secondary | ICD-10-CM | POA: Diagnosis not present

## 2020-05-15 DIAGNOSIS — J3089 Other allergic rhinitis: Secondary | ICD-10-CM | POA: Diagnosis not present

## 2020-05-15 DIAGNOSIS — J3081 Allergic rhinitis due to animal (cat) (dog) hair and dander: Secondary | ICD-10-CM | POA: Diagnosis not present

## 2020-05-15 DIAGNOSIS — R21 Rash and other nonspecific skin eruption: Secondary | ICD-10-CM | POA: Diagnosis not present

## 2020-05-15 DIAGNOSIS — J301 Allergic rhinitis due to pollen: Secondary | ICD-10-CM | POA: Diagnosis not present

## 2020-06-09 DIAGNOSIS — E7800 Pure hypercholesterolemia, unspecified: Secondary | ICD-10-CM | POA: Diagnosis not present

## 2020-06-09 DIAGNOSIS — I10 Essential (primary) hypertension: Secondary | ICD-10-CM | POA: Diagnosis not present

## 2020-06-09 DIAGNOSIS — D509 Iron deficiency anemia, unspecified: Secondary | ICD-10-CM | POA: Diagnosis not present

## 2020-06-09 DIAGNOSIS — Z0000 Encounter for general adult medical examination without abnormal findings: Secondary | ICD-10-CM | POA: Diagnosis not present

## 2020-06-09 DIAGNOSIS — D649 Anemia, unspecified: Secondary | ICD-10-CM | POA: Diagnosis not present

## 2020-07-09 DIAGNOSIS — B079 Viral wart, unspecified: Secondary | ICD-10-CM | POA: Diagnosis not present

## 2020-07-09 DIAGNOSIS — D485 Neoplasm of uncertain behavior of skin: Secondary | ICD-10-CM | POA: Diagnosis not present

## 2020-07-09 DIAGNOSIS — Z85828 Personal history of other malignant neoplasm of skin: Secondary | ICD-10-CM | POA: Diagnosis not present

## 2020-07-09 DIAGNOSIS — L821 Other seborrheic keratosis: Secondary | ICD-10-CM | POA: Diagnosis not present

## 2020-07-09 DIAGNOSIS — D225 Melanocytic nevi of trunk: Secondary | ICD-10-CM | POA: Diagnosis not present

## 2020-07-09 DIAGNOSIS — D1801 Hemangioma of skin and subcutaneous tissue: Secondary | ICD-10-CM | POA: Diagnosis not present

## 2020-07-09 DIAGNOSIS — L718 Other rosacea: Secondary | ICD-10-CM | POA: Diagnosis not present

## 2020-07-09 DIAGNOSIS — L814 Other melanin hyperpigmentation: Secondary | ICD-10-CM | POA: Diagnosis not present

## 2020-09-15 ENCOUNTER — Other Ambulatory Visit: Payer: Self-pay | Admitting: Internal Medicine

## 2020-09-15 DIAGNOSIS — Z1231 Encounter for screening mammogram for malignant neoplasm of breast: Secondary | ICD-10-CM

## 2020-11-04 ENCOUNTER — Inpatient Hospital Stay: Admission: RE | Admit: 2020-11-04 | Payer: Medicare Other | Source: Ambulatory Visit

## 2020-11-12 ENCOUNTER — Ambulatory Visit
Admission: RE | Admit: 2020-11-12 | Discharge: 2020-11-12 | Disposition: A | Discharge status: Home / Self Care | Payer: Medicare Other | Source: Ambulatory Visit | Attending: Internal Medicine | Admitting: Internal Medicine

## 2020-11-12 ENCOUNTER — Other Ambulatory Visit: Payer: Self-pay

## 2020-11-12 DIAGNOSIS — Z1231 Encounter for screening mammogram for malignant neoplasm of breast: Secondary | ICD-10-CM

## 2021-01-07 DIAGNOSIS — L820 Inflamed seborrheic keratosis: Secondary | ICD-10-CM | POA: Diagnosis not present

## 2021-01-07 DIAGNOSIS — L304 Erythema intertrigo: Secondary | ICD-10-CM | POA: Diagnosis not present

## 2021-01-07 DIAGNOSIS — Z85828 Personal history of other malignant neoplasm of skin: Secondary | ICD-10-CM | POA: Diagnosis not present

## 2021-01-31 DIAGNOSIS — Z20822 Contact with and (suspected) exposure to covid-19: Secondary | ICD-10-CM | POA: Diagnosis not present

## 2021-03-25 DIAGNOSIS — H353111 Nonexudative age-related macular degeneration, right eye, early dry stage: Secondary | ICD-10-CM | POA: Diagnosis not present

## 2021-05-04 DIAGNOSIS — Z23 Encounter for immunization: Secondary | ICD-10-CM | POA: Diagnosis not present

## 2021-07-09 DIAGNOSIS — D225 Melanocytic nevi of trunk: Secondary | ICD-10-CM | POA: Diagnosis not present

## 2021-07-09 DIAGNOSIS — L718 Other rosacea: Secondary | ICD-10-CM | POA: Diagnosis not present

## 2021-07-09 DIAGNOSIS — L821 Other seborrheic keratosis: Secondary | ICD-10-CM | POA: Diagnosis not present

## 2021-07-09 DIAGNOSIS — B078 Other viral warts: Secondary | ICD-10-CM | POA: Diagnosis not present

## 2021-07-09 DIAGNOSIS — L570 Actinic keratosis: Secondary | ICD-10-CM | POA: Diagnosis not present

## 2021-07-09 DIAGNOSIS — Z85828 Personal history of other malignant neoplasm of skin: Secondary | ICD-10-CM | POA: Diagnosis not present

## 2021-07-09 DIAGNOSIS — Z20822 Contact with and (suspected) exposure to covid-19: Secondary | ICD-10-CM | POA: Diagnosis not present

## 2021-07-09 DIAGNOSIS — L720 Epidermal cyst: Secondary | ICD-10-CM | POA: Diagnosis not present

## 2021-07-09 DIAGNOSIS — L814 Other melanin hyperpigmentation: Secondary | ICD-10-CM | POA: Diagnosis not present

## 2021-08-26 DIAGNOSIS — D508 Other iron deficiency anemias: Secondary | ICD-10-CM | POA: Diagnosis not present

## 2021-08-26 DIAGNOSIS — E559 Vitamin D deficiency, unspecified: Secondary | ICD-10-CM | POA: Diagnosis not present

## 2021-08-26 DIAGNOSIS — I10 Essential (primary) hypertension: Secondary | ICD-10-CM | POA: Diagnosis not present

## 2021-08-26 DIAGNOSIS — E7800 Pure hypercholesterolemia, unspecified: Secondary | ICD-10-CM | POA: Diagnosis not present

## 2021-08-26 DIAGNOSIS — E538 Deficiency of other specified B group vitamins: Secondary | ICD-10-CM | POA: Diagnosis not present

## 2021-09-02 DIAGNOSIS — E559 Vitamin D deficiency, unspecified: Secondary | ICD-10-CM | POA: Diagnosis not present

## 2021-09-02 DIAGNOSIS — D508 Other iron deficiency anemias: Secondary | ICD-10-CM | POA: Diagnosis not present

## 2021-09-02 DIAGNOSIS — I10 Essential (primary) hypertension: Secondary | ICD-10-CM | POA: Diagnosis not present

## 2021-09-02 DIAGNOSIS — E7800 Pure hypercholesterolemia, unspecified: Secondary | ICD-10-CM | POA: Diagnosis not present

## 2021-09-02 DIAGNOSIS — E538 Deficiency of other specified B group vitamins: Secondary | ICD-10-CM | POA: Diagnosis not present

## 2021-09-04 DIAGNOSIS — E7800 Pure hypercholesterolemia, unspecified: Secondary | ICD-10-CM | POA: Diagnosis not present

## 2021-09-04 DIAGNOSIS — E559 Vitamin D deficiency, unspecified: Secondary | ICD-10-CM | POA: Diagnosis not present

## 2021-09-04 DIAGNOSIS — I10 Essential (primary) hypertension: Secondary | ICD-10-CM | POA: Diagnosis not present

## 2021-09-04 DIAGNOSIS — Z0000 Encounter for general adult medical examination without abnormal findings: Secondary | ICD-10-CM | POA: Diagnosis not present

## 2021-09-04 DIAGNOSIS — D582 Other hemoglobinopathies: Secondary | ICD-10-CM | POA: Diagnosis not present

## 2021-09-22 DIAGNOSIS — I10 Essential (primary) hypertension: Secondary | ICD-10-CM | POA: Diagnosis not present

## 2021-10-16 DIAGNOSIS — I10 Essential (primary) hypertension: Secondary | ICD-10-CM | POA: Diagnosis not present

## 2021-10-20 DIAGNOSIS — Z20822 Contact with and (suspected) exposure to covid-19: Secondary | ICD-10-CM | POA: Diagnosis not present

## 2021-11-10 DIAGNOSIS — Z20822 Contact with and (suspected) exposure to covid-19: Secondary | ICD-10-CM | POA: Diagnosis not present

## 2021-11-16 DIAGNOSIS — Z20822 Contact with and (suspected) exposure to covid-19: Secondary | ICD-10-CM | POA: Diagnosis not present

## 2021-11-18 DIAGNOSIS — Z20822 Contact with and (suspected) exposure to covid-19: Secondary | ICD-10-CM | POA: Diagnosis not present

## 2021-11-27 DIAGNOSIS — Z20822 Contact with and (suspected) exposure to covid-19: Secondary | ICD-10-CM | POA: Diagnosis not present

## 2021-12-01 DIAGNOSIS — Z20822 Contact with and (suspected) exposure to covid-19: Secondary | ICD-10-CM | POA: Diagnosis not present

## 2021-12-05 IMAGING — MR MR SHOULDER*R* W/O CM
5 series · 35 of 40 positions shown · non-contrast
Comparison: None.

CLINICAL DATA: Limited range of motion for the past 1.5 months. No
known injury.

EXAM:
MRI OF THE RIGHT SHOULDER WITHOUT CONTRAST
TECHNIQUE: Multiplanar, multisequence MR imaging of the shoulder was performed.
No intravenous contrast was administered.

[Series 3: PD fat-sat · axial · 4.0mm · 0.55mm/px · z∈[-5,+79]mm · 7 of 20 slices shown (1 of 2)]
[im 1/20]
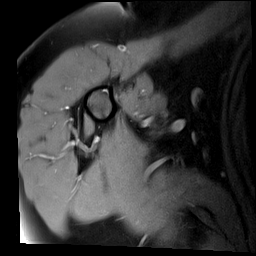
[im 4/20]
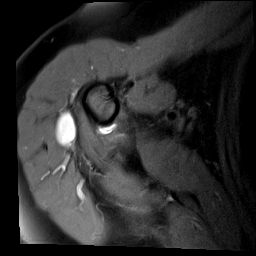
[im 7/20]
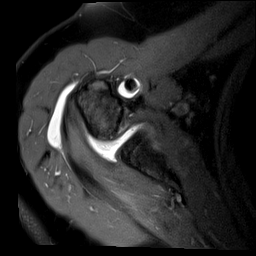
[im 10/20]
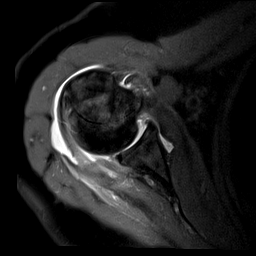
[im 13/20]
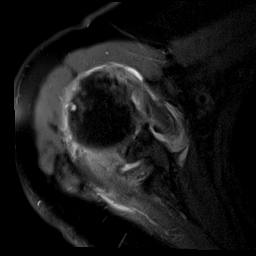
[im 16/20]
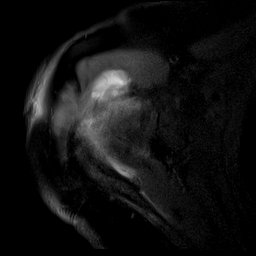
[im 20/20]
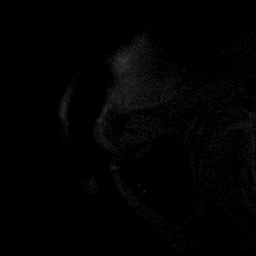

[Series 4: T2 fat-sat · oblique · 4.0mm · 0.55mm/px · 7 of 16 slices shown (1 of 2)]
[im 1/16]
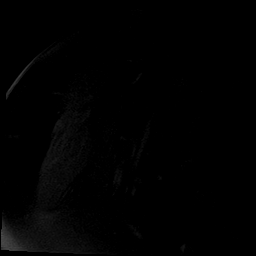
[im 3/16]
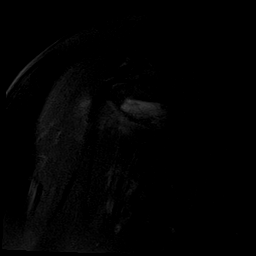
[im 6/16]
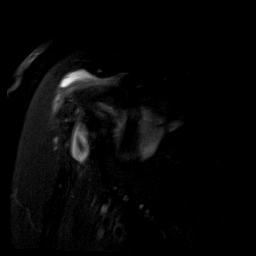
[im 8/16]
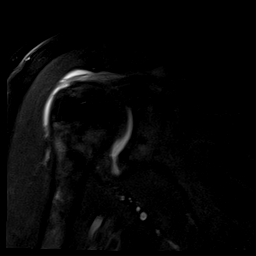
[im 11/16]
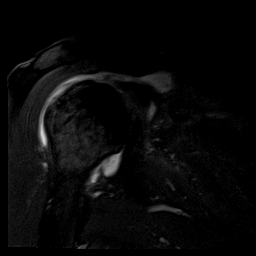
[im 13/16]
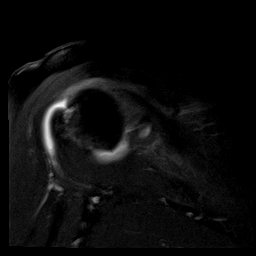
[im 16/16]
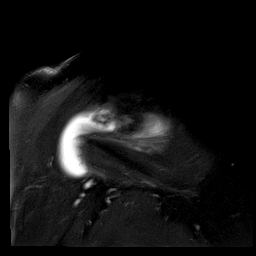

[Series 5: PD fat-sat · oblique · 4.0mm · 0.27mm/px · 8 of 18 slices shown (2 of 2)]
[im 1/18]
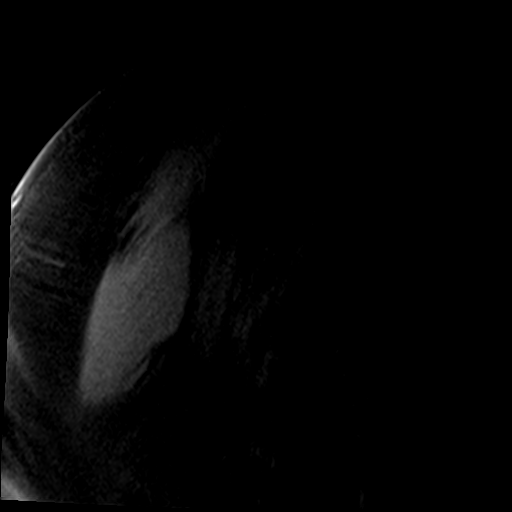
[im 3/18]
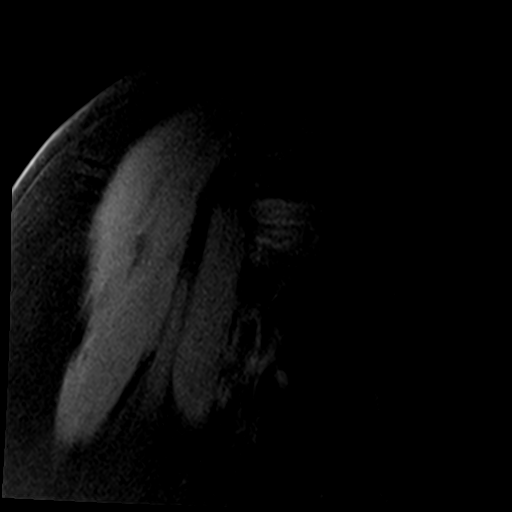
[im 5/18]
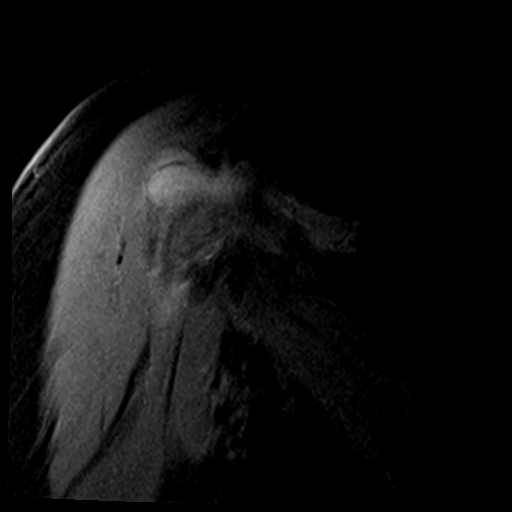
[im 8/18]
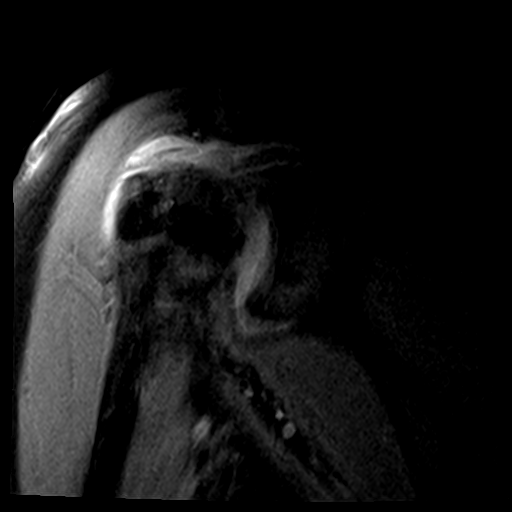
[im 10/18]
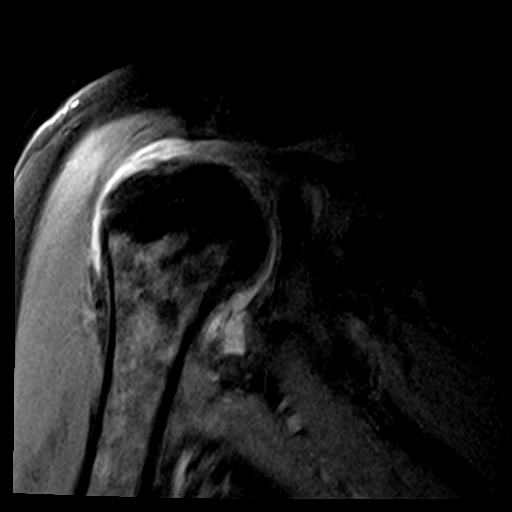
[im 13/18]
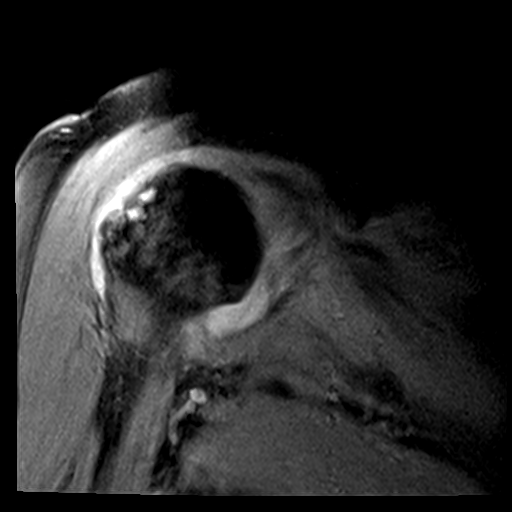
[im 15/18]
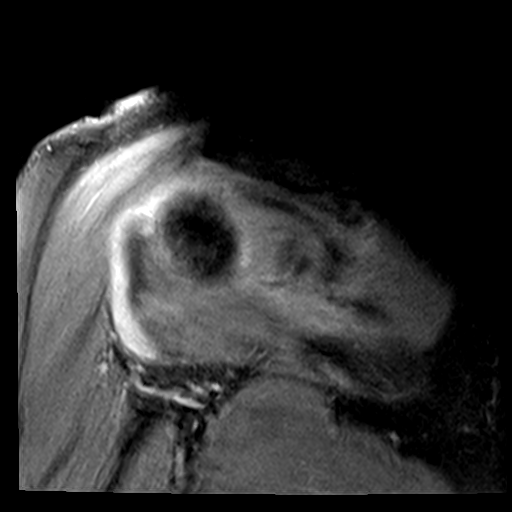
[im 18/18]
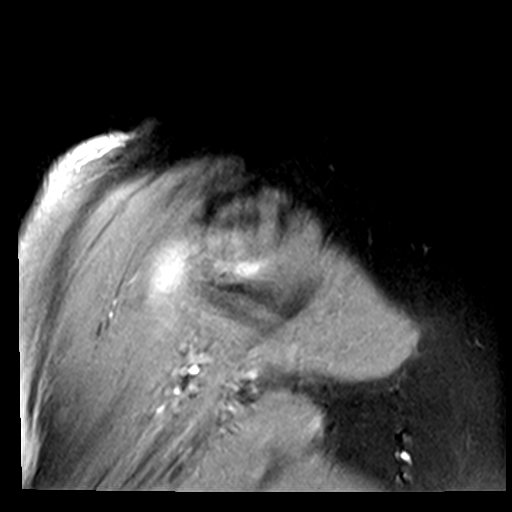

[Series 6: T1 · oblique · 4.0mm · 0.27mm/px · 4 of 21 slices shown]
[im 1/21]
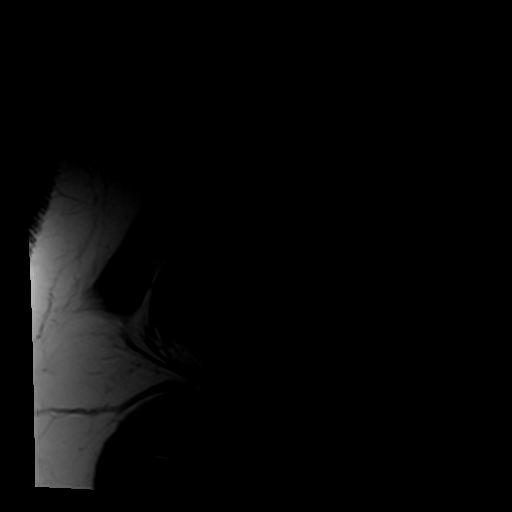
[im 3/21]
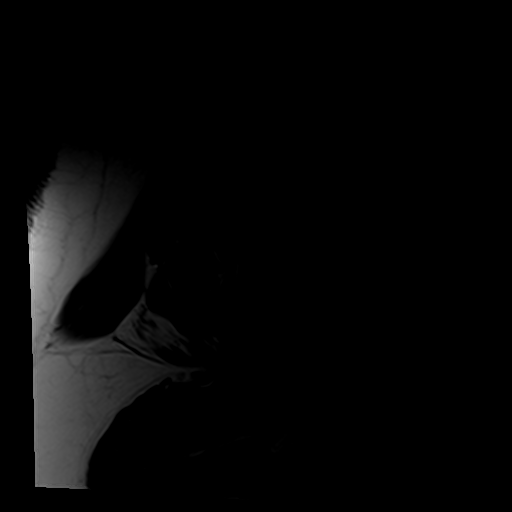
[im 6/21]
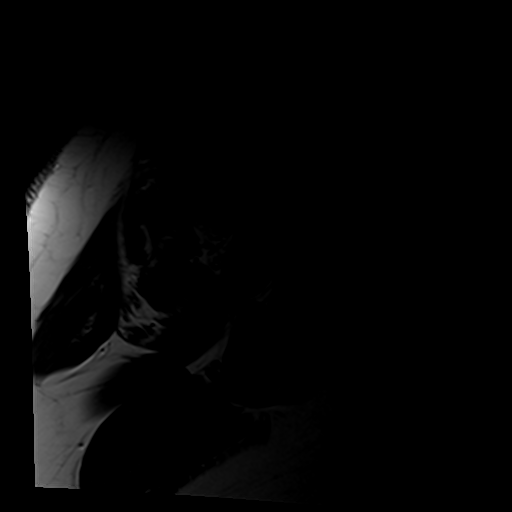
[im 8/21]
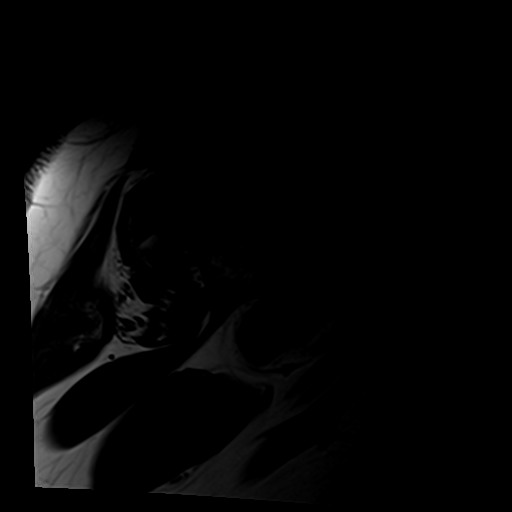

[Series 7: T2 fat-sat · oblique · 4.0mm · 0.55mm/px · 9 of 21 slices shown (2 of 2)]
[im 1/21]
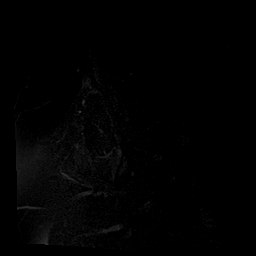
[im 3/21]
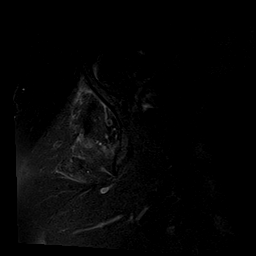
[im 6/21]
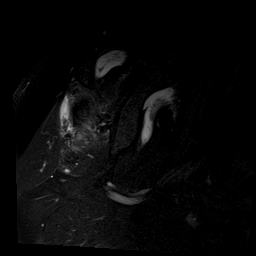
[im 8/21]
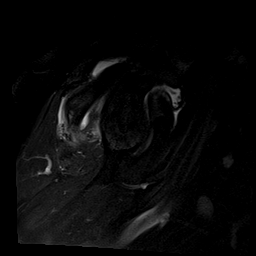
[im 11/21]
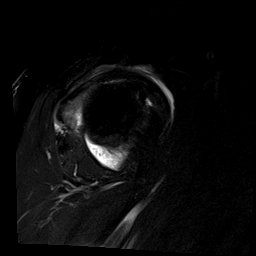
[im 13/21]
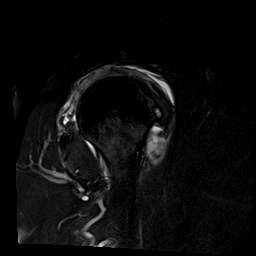
[im 16/21]
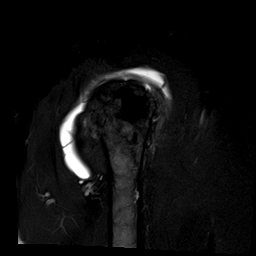
[im 18/21]
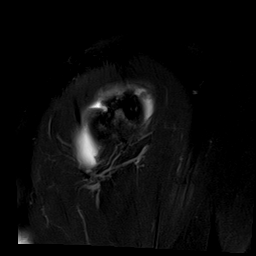
[im 21/21]
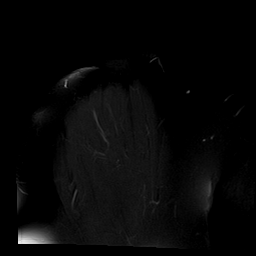

[35 of 40 positions shown; findings below may reference images not displayed]

FINDINGS: Rotator cuff: Complete tear of the supraspinatus and infraspinatus
tendons with 2.4 cm of retraction. Teres minor tendon is intact.
Moderate tendinosis of the subscapularis tendon with a
partial-thickness tear.

Muscles: No muscle atrophy or edema. No intramuscular fluid
collection or hematoma.

Biceps Long Head: Severe tendinosis of the intra-articular portion
and proximal extra-articular portion of the long head of the biceps
tendon.

Acromioclavicular Joint: Moderate arthropathy of the
acromioclavicular joint. Type I acromion. Moderate amount of fluid
in the subacromial/subdeltoid bursa.

Glenohumeral Joint: Moderate joint effusion. Mild chondral thinning.

Labrum: Grossly intact, but evaluation is limited by lack of
intraarticular fluid/contrast.

Bones: No fracture or dislocation. No aggressive osseous lesion.

Other: No fluid collection or hematoma.
IMPRESSION: 1. Complete tear of the supraspinatus and infraspinatus tendons with
2.4 cm of retraction.
2. Moderate tendinosis of the subscapularis tendon with a
partial-thickness tear.
3. Severe tendinosis of the intra-articular portion and proximal
extra-articular portion of the long head of the biceps tendon.

## 2021-12-07 DIAGNOSIS — Z20822 Contact with and (suspected) exposure to covid-19: Secondary | ICD-10-CM | POA: Diagnosis not present

## 2022-03-08 DIAGNOSIS — Z85828 Personal history of other malignant neoplasm of skin: Secondary | ICD-10-CM | POA: Diagnosis not present

## 2022-03-08 DIAGNOSIS — L821 Other seborrheic keratosis: Secondary | ICD-10-CM | POA: Diagnosis not present

## 2022-03-11 DIAGNOSIS — D582 Other hemoglobinopathies: Secondary | ICD-10-CM | POA: Diagnosis not present

## 2022-03-11 DIAGNOSIS — Z0000 Encounter for general adult medical examination without abnormal findings: Secondary | ICD-10-CM | POA: Diagnosis not present

## 2022-03-11 DIAGNOSIS — E7800 Pure hypercholesterolemia, unspecified: Secondary | ICD-10-CM | POA: Diagnosis not present

## 2022-03-11 DIAGNOSIS — I10 Essential (primary) hypertension: Secondary | ICD-10-CM | POA: Diagnosis not present

## 2022-03-11 DIAGNOSIS — E559 Vitamin D deficiency, unspecified: Secondary | ICD-10-CM | POA: Diagnosis not present

## 2022-03-11 DIAGNOSIS — D508 Other iron deficiency anemias: Secondary | ICD-10-CM | POA: Diagnosis not present

## 2022-03-11 DIAGNOSIS — Z789 Other specified health status: Secondary | ICD-10-CM | POA: Diagnosis not present

## 2022-03-12 DIAGNOSIS — E7800 Pure hypercholesterolemia, unspecified: Secondary | ICD-10-CM | POA: Diagnosis not present

## 2022-03-12 DIAGNOSIS — Z789 Other specified health status: Secondary | ICD-10-CM | POA: Diagnosis not present

## 2022-03-12 DIAGNOSIS — E559 Vitamin D deficiency, unspecified: Secondary | ICD-10-CM | POA: Diagnosis not present

## 2022-03-12 DIAGNOSIS — E538 Deficiency of other specified B group vitamins: Secondary | ICD-10-CM | POA: Diagnosis not present

## 2022-03-12 DIAGNOSIS — I10 Essential (primary) hypertension: Secondary | ICD-10-CM | POA: Diagnosis not present

## 2022-05-05 DIAGNOSIS — H31011 Macula scars of posterior pole (postinflammatory) (post-traumatic), right eye: Secondary | ICD-10-CM | POA: Diagnosis not present

## 2022-05-31 DIAGNOSIS — Z23 Encounter for immunization: Secondary | ICD-10-CM | POA: Diagnosis not present

## 2022-06-08 DIAGNOSIS — Z23 Encounter for immunization: Secondary | ICD-10-CM | POA: Diagnosis not present

## 2022-07-14 DIAGNOSIS — D1801 Hemangioma of skin and subcutaneous tissue: Secondary | ICD-10-CM | POA: Diagnosis not present

## 2022-07-14 DIAGNOSIS — L821 Other seborrheic keratosis: Secondary | ICD-10-CM | POA: Diagnosis not present

## 2022-07-14 DIAGNOSIS — D225 Melanocytic nevi of trunk: Secondary | ICD-10-CM | POA: Diagnosis not present

## 2022-07-14 DIAGNOSIS — L814 Other melanin hyperpigmentation: Secondary | ICD-10-CM | POA: Diagnosis not present

## 2022-07-14 DIAGNOSIS — Z85828 Personal history of other malignant neoplasm of skin: Secondary | ICD-10-CM | POA: Diagnosis not present

## 2023-09-01 ENCOUNTER — Other Ambulatory Visit: Payer: Self-pay | Admitting: Medical Genetics

## 2024-05-22 ENCOUNTER — Other Ambulatory Visit: Payer: Self-pay | Admitting: Medical Genetics

## 2024-05-22 DIAGNOSIS — Z006 Encounter for examination for normal comparison and control in clinical research program: Secondary | ICD-10-CM

## 2024-07-11 LAB — GENECONNECT MOLECULAR SCREEN: Genetic Analysis Overall Interpretation: NEGATIVE
